# Patient Record
Sex: Female | Born: 1935 | Hispanic: No | Smoking: Never smoker
Health system: Southern US, Community
[De-identification: ages and names within clinical notes are randomized; demographics above are authoritative.]

## PROBLEM LIST (undated history)

## (undated) DIAGNOSIS — E039 Hypothyroidism, unspecified: Secondary | ICD-10-CM

## (undated) DIAGNOSIS — J189 Pneumonia, unspecified organism: Secondary | ICD-10-CM

## (undated) DIAGNOSIS — H5440 Blindness, one eye, unspecified eye: Secondary | ICD-10-CM

## (undated) DIAGNOSIS — E7800 Pure hypercholesterolemia, unspecified: Secondary | ICD-10-CM

## (undated) HISTORY — PX: LARYNX SURGERY: SHX692

---

## 2004-11-14 HISTORY — PX: CATARACT EXTRACTION, BILATERAL: SHX1313

## 2013-02-20 ENCOUNTER — Encounter (HOSPITAL_COMMUNITY): Payer: Self-pay | Admitting: General Practice

## 2013-02-20 ENCOUNTER — Ambulatory Visit
Admission: RE | Admit: 2013-02-20 | Discharge: 2013-02-20 | Disposition: A | Discharge status: Home / Self Care | Payer: Medicare Other | Source: Ambulatory Visit | Attending: Cardiovascular Disease | Admitting: Cardiovascular Disease

## 2013-02-20 ENCOUNTER — Other Ambulatory Visit: Payer: Self-pay | Admitting: Cardiovascular Disease

## 2013-02-20 ENCOUNTER — Inpatient Hospital Stay (HOSPITAL_COMMUNITY)
Admission: AD | Admit: 2013-02-20 | Discharge: 2013-02-22 | DRG: 195 | Disposition: A | Discharge status: Home / Self Care | Payer: Medicare Other | Source: Ambulatory Visit | Attending: Cardiovascular Disease | Admitting: Cardiovascular Disease

## 2013-02-20 DIAGNOSIS — R079 Chest pain, unspecified: Secondary | ICD-10-CM | POA: Diagnosis present

## 2013-02-20 DIAGNOSIS — R05 Cough: Secondary | ICD-10-CM | POA: Diagnosis present

## 2013-02-20 DIAGNOSIS — I10 Essential (primary) hypertension: Secondary | ICD-10-CM | POA: Diagnosis present

## 2013-02-20 DIAGNOSIS — I059 Rheumatic mitral valve disease, unspecified: Secondary | ICD-10-CM | POA: Diagnosis present

## 2013-02-20 DIAGNOSIS — I379 Nonrheumatic pulmonary valve disorder, unspecified: Secondary | ICD-10-CM | POA: Diagnosis present

## 2013-02-20 DIAGNOSIS — R059 Cough, unspecified: Secondary | ICD-10-CM | POA: Diagnosis present

## 2013-02-20 DIAGNOSIS — J40 Bronchitis, not specified as acute or chronic: Secondary | ICD-10-CM

## 2013-02-20 DIAGNOSIS — E785 Hyperlipidemia, unspecified: Secondary | ICD-10-CM | POA: Diagnosis present

## 2013-02-20 DIAGNOSIS — I079 Rheumatic tricuspid valve disease, unspecified: Secondary | ICD-10-CM | POA: Diagnosis present

## 2013-02-20 DIAGNOSIS — J984 Other disorders of lung: Secondary | ICD-10-CM | POA: Diagnosis present

## 2013-02-20 DIAGNOSIS — I359 Nonrheumatic aortic valve disorder, unspecified: Secondary | ICD-10-CM | POA: Diagnosis present

## 2013-02-20 DIAGNOSIS — Z79899 Other long term (current) drug therapy: Secondary | ICD-10-CM

## 2013-02-20 DIAGNOSIS — J189 Pneumonia, unspecified organism: Secondary | ICD-10-CM

## 2013-02-20 HISTORY — DX: Hypothyroidism, unspecified: E03.9

## 2013-02-20 HISTORY — DX: Blindness, one eye, unspecified eye: H54.40

## 2013-02-20 HISTORY — DX: Pneumonia, unspecified organism: J18.9

## 2013-02-20 HISTORY — DX: Pure hypercholesterolemia, unspecified: E78.00

## 2013-02-20 LAB — CBC WITH DIFFERENTIAL/PLATELET
Basophils Relative: 0 % (ref 0–1)
Eosinophils Absolute: 0.1 10*3/uL (ref 0.0–0.7)
Eosinophils Relative: 2 % (ref 0–5)
Lymphs Abs: 2.3 10*3/uL (ref 0.7–4.0)
MCH: 28.4 pg (ref 26.0–34.0)
MCHC: 34.1 g/dL (ref 30.0–36.0)
MCV: 83.2 fL (ref 78.0–100.0)
Platelets: 282 10*3/uL (ref 150–400)
RBC: 4.23 MIL/uL (ref 3.87–5.11)
RDW: 13.7 % (ref 11.5–15.5)

## 2013-02-20 LAB — COMPREHENSIVE METABOLIC PANEL
ALT: 27 U/L (ref 0–35)
AST: 25 U/L (ref 0–37)
Albumin: 2.8 g/dL — ABNORMAL LOW (ref 3.5–5.2)
Alkaline Phosphatase: 83 U/L (ref 39–117)
CO2: 28 mEq/L (ref 19–32)
Chloride: 100 mEq/L (ref 96–112)
Potassium: 3.9 mEq/L (ref 3.5–5.1)
Total Bilirubin: 0.2 mg/dL — ABNORMAL LOW (ref 0.3–1.2)

## 2013-02-20 MED ORDER — SODIUM CHLORIDE 0.9 % IV SOLN
INTRAVENOUS | Status: DC
  Administered 2013-02-20 – 2013-02-21 (×2): via INTRAVENOUS

## 2013-02-20 MED ORDER — ALBUTEROL SULFATE (5 MG/ML) 0.5% IN NEBU: 2.5000 mg | INHALATION_SOLUTION | RESPIRATORY_TRACT | Status: DC | PRN

## 2013-02-20 MED ORDER — GUAIFENESIN-DM 100-10 MG/5ML PO SYRP
10.0000 mL | ORAL_SOLUTION | ORAL | Status: DC | PRN
  Administered 2013-02-21 – 2013-02-22 (×3): 10 mL via ORAL
  Filled 2013-02-20 (×3): qty 10

## 2013-02-20 MED ORDER — ALBUTEROL SULFATE (5 MG/ML) 0.5% IN NEBU
2.5000 mg | INHALATION_SOLUTION | Freq: Four times a day (QID) | RESPIRATORY_TRACT | Status: DC
  Filled 2013-02-20: qty 0.5

## 2013-02-20 MED ORDER — DEXTROSE 5 % IV SOLN
1.0000 g | INTRAVENOUS | Status: DC
  Administered 2013-02-20 – 2013-02-21 (×2): 1 g via INTRAVENOUS
  Filled 2013-02-20 (×3): qty 10

## 2013-02-20 MED ORDER — HEPARIN SODIUM (PORCINE) 5000 UNIT/ML IJ SOLN
5000.0000 [IU] | Freq: Three times a day (TID) | INTRAMUSCULAR | Status: DC
  Administered 2013-02-20 – 2013-02-22 (×6): 5000 [IU] via SUBCUTANEOUS
  Filled 2013-02-20 (×9): qty 1

## 2013-02-20 MED ORDER — DEXTROSE 5 % IV SOLN
500.0000 mg | INTRAVENOUS | Status: DC
  Administered 2013-02-20 – 2013-02-21 (×2): 500 mg via INTRAVENOUS
  Filled 2013-02-20 (×3): qty 500

## 2013-02-20 NOTE — H&P (Signed)
Caroline Ramsey is an 79 y.o. female.   Chief Complaint: Chest pain, cough and chills HPI: 79 years old female with 2 day history of fever, chills and cough has chest pain and x-ray finding of pneumonia.   No past medical history on file.  No DM, II, + hypertension, No smoking or alcohol intake.  No past surgical history on file. Bilateral cataract surgery, Left sided neck surgery  For vocal cord problem. No family history on file. Non-contributery. Social History:  has no tobacco, alcohol, and drug history on file.  Allergies: Allergies not on file. None.  No prescriptions prior to admission    No results found for this or any previous visit (from the past 48 hour(s)). Dg Chest 2 View  02/20/2013  *RADIOLOGY REPORT*  Clinical Data: , evaluate for pneumonia.  Fever  CHEST - 2 VIEW  Comparison: None  Findings: There is moderate cardiac enlargement.  The lungs are hyperinflated.  No pleural effusion or edema. Advanced chronic lung disease is identified bilaterally suspicious for COPD/emphysema.  Superimposed left upper lobe airspace opacity is noted which is concerning for pneumonia.  IMPRESSION:  1.  Suspect left upper lobe pneumonia. 2.  Advanced chronic appearing lung disease. 3.  Cardiac enlargement.   Original Report Authenticated By: Signa Kell, M.D.     @ROS @ No weight gain or loss, + poor vision, + full upper and lower dentures, No asthma, No chest pain, No GI bleed, No stroke, No arthritis, No Kidney stone.   Blood pressure 165/53, pulse 70, temperature 97.9 F (36.6 C), temperature source Oral, resp. rate 20, height 4\' 10"  (1.473 m), weight 45.224 kg (99 lb 11.2 oz), SpO2 95.00%. Gen: Awake, Mild respiratory distress. HEENT: Normocephalic, atraumatic, Brown/black eyes, Tongue midline and pink. Neck: No JVD. No lymphadenopathy or tenderness. Lungs-Harsh breath sounds, bilaterally. Heart: S1, S2 normal, II/VI systolic murmur. Abdomen:- Soft and non-tender. Ext:- No swelling.  Early varicose veins. CNS:-AxOx 3. Moves all 4 ext.    Assessment/Plan Left lung pneumonia Hypertension Hyperlipidemia  Admit/IV antibiotic/Breathing treatments/Supplemental oxygen.  Halea Lieb S 02/20/2013, 2:41 PM

## 2013-02-21 MED ORDER — METOPROLOL SUCCINATE ER 25 MG PO TB24
25.0000 mg | ORAL_TABLET | Freq: Every day | ORAL | Status: DC
  Administered 2013-02-22: 25 mg via ORAL
  Filled 2013-02-21: qty 1

## 2013-02-21 MED ORDER — AMLODIPINE BESYLATE 2.5 MG PO TABS
2.5000 mg | ORAL_TABLET | Freq: Every day | ORAL | Status: DC
  Administered 2013-02-21 – 2013-02-22 (×2): 2.5 mg via ORAL
  Filled 2013-02-21 (×2): qty 1

## 2013-02-21 NOTE — Progress Notes (Signed)
  Echocardiogram 2D Echocardiogram has been performed.  Caroline Ramsey 02/21/2013, 10:29 AM

## 2013-02-21 NOTE — Progress Notes (Signed)
Subjective:  Feeling better. Afebrile. Echo cardiogram with mild MR, TR, AR and PR. EF-65 %  Objective:  Vital Signs in the last 24 hours: Temp:  [98.3 F (36.8 C)-98.7 F (37.1 C)] 98.3 F (36.8 C) (04/10 1339) Pulse Rate:  [66-76] 76 (04/10 1339) Cardiac Rhythm:  [-] Normal sinus rhythm (04/09 1940) Resp:  [20] 20 (04/10 1339) BP: (124-136)/(42-57) 124/57 mmHg (04/10 1339) SpO2:  [93 %-97 %] 97 % (04/10 1339) Weight:  [45 kg (99 lb 3.3 oz)] 45 kg (99 lb 3.3 oz) (04/10 0510)  Physical Exam: BP Readings from Last 1 Encounters:  02/21/13 124/57     Wt Readings from Last 1 Encounters:  02/21/13 45 kg (99 lb 3.3 oz)    Weight change:   HEENT: [place]/AT, Eyes-Brown/Black, PERL, EOMI, Conjunctiva-Pink, Sclera-Non-icteric Neck: No JVD, No bruit, Trachea midline. Lungs:  Clearing, Bilateral. Cardiac:  Regular rhythm, normal S1 and S2, no S3.  Abdomen:  Soft, non-tender. Extremities:  No edema present. No cyanosis. No clubbing. CNS: AxOx3, Cranial nerves grossly intact, moves all 4 extremities. Right handed. Skin: Warm and dry.   Intake/Output from previous day: 04/09 0701 - 04/10 0700 In: 1020 [P.O.:120; I.V.:900] Out: -     Lab Results: BMET    Component Value Date/Time   NA 137 02/20/2013 1534   K 3.9 02/20/2013 1534   CL 100 02/20/2013 1534   CO2 28 02/20/2013 1534   GLUCOSE 97 02/20/2013 1534   BUN 14 02/20/2013 1534   CREATININE 0.59 02/20/2013 1534   CALCIUM 9.5 02/20/2013 1534   GFRNONAA 82* 02/20/2013 1534   GFRAA >90 02/20/2013 1534   CBC    Component Value Date/Time   WBC 8.3 02/20/2013 1534   RBC 4.23 02/20/2013 1534   HGB 12.0 02/20/2013 1534   HCT 35.2* 02/20/2013 1534   PLT 282 02/20/2013 1534   MCV 83.2 02/20/2013 1534   MCH 28.4 02/20/2013 1534   MCHC 34.1 02/20/2013 1534   RDW 13.7 02/20/2013 1534   LYMPHSABS 2.3 02/20/2013 1534   MONOABS 0.5 02/20/2013 1534   EOSABS 0.1 02/20/2013 1534   BASOSABS 0.0 02/20/2013 1534   CARDIAC ENZYMES No results found for this basename: CKTOTAL,  CKMB, CKMBINDEX, TROPONINI    Scheduled Meds: . amLODipine  2.5 mg Oral Daily  . azithromycin  500 mg Intravenous Q24H  . cefTRIAXone (ROCEPHIN)  IV  1 g Intravenous Q24H  . heparin  5,000 Units Subcutaneous Q8H   Continuous Infusions: . sodium chloride 75 mL/hr at 02/21/13 0522   PRN Meds:.albuterol, guaiFENesin-dextromethorphan  Assessment/Plan: Left lung pneumonia  Hypertension  Hyperlipidemia Mitral regurgitation Aortic valve regurgitation Tricuspid regurgitation Pulmonary valve regurgitation  Continue medications. Add small dose of vasodilator and B-blocker.   LOS: 1 day    Orpah Cobb  MD  02/21/2013, 6:48 PM

## 2013-02-21 NOTE — Care Management Note (Unsigned)
    Page 1 of 1   02/21/2013     2:14:07 PM   CARE MANAGEMENT NOTE 02/21/2013  Patient:  Caroline Ramsey, Caroline Ramsey   Account Number:  [id number]  Date Initiated:  02/21/2013  Documentation initiated by:  Schelly Chuba  Subjective/Objective Assessment:   PT ADM ON 02/20/13 WITH SOB, PNA.  PTA, PT LIVES WITH GRANDSON, AND IS INDEPENDENT.     Action/Plan:   WILL FOLLOW FOR HOME NEEDS AS PT PROGRESSES.   Anticipated DC Date:  02/23/2013   Anticipated DC Plan:  HOME W HOME HEALTH SERVICES      DC Planning Services  CM consult      Choice offered to / List presented to:             Status of service:  In process, will continue to follow Medicare Important Message given?   (If response is "NO", the following Medicare IM given date fields will be blank) Date Medicare IM given:   Date Additional Medicare IM given:    Discharge Disposition:    Per UR Regulation:  Reviewed for med. necessity/level of care/duration of stay  If discussed at Long Length of Stay Meetings, dates discussed:    Comments:

## 2013-02-22 MED ORDER — MOXIFLOXACIN HCL 400 MG PO TABS: 400.0000 mg | ORAL_TABLET | Freq: Every day | ORAL | Status: DC

## 2013-02-22 MED ORDER — AZITHROMYCIN 250 MG PO TABS: 250.0000 mg | ORAL_TABLET | Freq: Every day | ORAL | Status: DC

## 2013-02-22 MED ORDER — AZITHROMYCIN 250 MG PO TABS
250.0000 mg | ORAL_TABLET | Freq: Every day | ORAL | Status: DC
  Administered 2013-02-22: 250 mg via ORAL
  Filled 2013-02-22: qty 1

## 2013-02-22 MED ORDER — AZITHROMYCIN 250 MG PO TABS: ORAL_TABLET | ORAL | Status: DC

## 2013-02-22 MED ORDER — METOPROLOL SUCCINATE ER 25 MG PO TB24: 25.0000 mg | ORAL_TABLET | Freq: Every day | ORAL | Status: DC

## 2013-02-22 NOTE — Discharge Summary (Signed)
Physician Discharge Summary  Patient ID: Caroline Ramsey MRN: [id number] DOB/AGE: [birth year] 77 y.o.  Admit date: 02/20/2013 Discharge date: 02/22/2013  Admission Diagnoses: Left lung pneumonia  Hypertension  Hyperlipidemia  Discharge Diagnoses:  Principle Problem:   * Acute left lung pneumonia* Hypertension  Hyperlipidemia  Mitral regurgitation  Aortic valve regurgitation  Tricuspid regurgitation  Pulmonary valve regurgitation  Discharged Condition: good  Hospital Course: 79 years old female with 2 day history of fever, chills and cough has chest pain and x-ray finding of pneumonia. She responded well to IV antibiotic and breathing treatments. She was switched to oral antibiotic in AM and discharged home in stable condition.  Consults: None  Significant Diagnostic Studies: labs: Normal electrolytes and CBC.  Radiology: CXR: Left upper lobe pneumonia, Chronic lung disease and cardiac enlargement.  Treatments: IV hydration and antibiotics: ceftriaxone and azithromycin  Discharge Exam: Blood pressure 137/58, pulse 66, temperature 98.2 F (36.8 C), temperature source Oral, resp. rate 18, height 4\' 10"  (1.473 m), weight 46 kg (101 lb 6.6 oz), SpO2 96.00%. Gen: Awake, No respiratory distress.  HEENT: Normocephalic, atraumatic, Brown/black eyes, Tongue midline and pink.  Neck: No JVD. No lymphadenopathy or tenderness.  Lungs-Harsh breath sounds, bilaterally.  Heart: S1, S2 normal, II/VI systolic murmur.  Abdomen:- Soft and non-tender.  Ext:- No swelling. Early varicose veins.  CNS:-AxOx 3. Moves all 4 ext.  Skin:-Warm and dry.  Disposition: 01 Home, Self care.     Medication List    TAKE these medications       alendronate 70 MG tablet  Commonly known as:  FOSAMAX  Take 70 mg by mouth every 7 (seven) days. Take with a full glass of water on an empty stomach.     amLODipine 2.5 MG tablet  Commonly known as:  NORVASC  Take 2.5 mg by mouth daily.     azithromycin 250  MG tablet  Commonly known as:  ZITHROMAX  One PO daily x 4 days     levothyroxine 50 MCG tablet  Commonly known as:  SYNTHROID, LEVOTHROID  Take 50 mcg by mouth See admin instructions. Monday through Friday     levothyroxine 75 MCG tablet  Commonly known as:  SYNTHROID, LEVOTHROID  Take 75 mcg by mouth 2 (two) times a week.     metoprolol succinate 25 MG 24 hr tablet  Commonly known as:  TOPROL-XL  Take 1 tablet (25 mg total) by mouth daily.     moxifloxacin 400 MG tablet  Commonly known as:  AVELOX  Take 1 tablet (400 mg total) by mouth daily.     simvastatin 10 MG tablet  Commonly known as:  ZOCOR  Take 10 mg by mouth at bedtime.           Follow-up Information   Follow up with [facility] S, MD. Schedule an appointment as soon as possible for a visit in 1 week.   Contact information:   [address] [place] [address] [phone]       Signed: Ricki Rodriguez 02/22/2013, 4:26 PM

## 2013-02-26 LAB — CULTURE, BLOOD (ROUTINE X 2): Culture: NO GROWTH

## 2015-01-22 ENCOUNTER — Emergency Department (HOSPITAL_COMMUNITY)
Admission: EM | Admit: 2015-01-22 | Discharge: 2015-01-22 | Disposition: A | Discharge status: Home / Self Care | Payer: Medicare Other | Attending: Emergency Medicine | Admitting: Emergency Medicine

## 2015-01-22 ENCOUNTER — Emergency Department (HOSPITAL_COMMUNITY): Payer: Medicare Other

## 2015-01-22 ENCOUNTER — Encounter (HOSPITAL_COMMUNITY): Payer: Self-pay | Admitting: Emergency Medicine

## 2015-01-22 DIAGNOSIS — R05 Cough: Secondary | ICD-10-CM | POA: Diagnosis not present

## 2015-01-22 DIAGNOSIS — E780 Pure hypercholesterolemia: Secondary | ICD-10-CM | POA: Diagnosis not present

## 2015-01-22 DIAGNOSIS — R079 Chest pain, unspecified: Secondary | ICD-10-CM | POA: Diagnosis present

## 2015-01-22 DIAGNOSIS — R059 Cough, unspecified: Secondary | ICD-10-CM

## 2015-01-22 DIAGNOSIS — R0789 Other chest pain: Secondary | ICD-10-CM | POA: Diagnosis not present

## 2015-01-22 DIAGNOSIS — R509 Fever, unspecified: Secondary | ICD-10-CM | POA: Diagnosis not present

## 2015-01-22 DIAGNOSIS — Z8701 Personal history of pneumonia (recurrent): Secondary | ICD-10-CM | POA: Insufficient documentation

## 2015-01-22 DIAGNOSIS — H5442 Blindness, left eye, normal vision right eye: Secondary | ICD-10-CM | POA: Insufficient documentation

## 2015-01-22 DIAGNOSIS — E039 Hypothyroidism, unspecified: Secondary | ICD-10-CM | POA: Insufficient documentation

## 2015-01-22 DIAGNOSIS — Z792 Long term (current) use of antibiotics: Secondary | ICD-10-CM | POA: Diagnosis not present

## 2015-01-22 DIAGNOSIS — Z79899 Other long term (current) drug therapy: Secondary | ICD-10-CM | POA: Diagnosis not present

## 2015-01-22 LAB — URINALYSIS, ROUTINE W REFLEX MICROSCOPIC
BILIRUBIN URINE: NEGATIVE
GLUCOSE, UA: NEGATIVE mg/dL
KETONES UR: 15 mg/dL — AB
Leukocytes, UA: NEGATIVE
Nitrite: NEGATIVE
PROTEIN: NEGATIVE mg/dL
SPECIFIC GRAVITY, URINE: 1.009 (ref 1.005–1.030)
Urobilinogen, UA: 0.2 mg/dL (ref 0.0–1.0)
pH: 6 (ref 5.0–8.0)

## 2015-01-22 LAB — COMPREHENSIVE METABOLIC PANEL
ALK PHOS: 60 U/L (ref 39–117)
ALT: 9 U/L (ref 0–35)
ANION GAP: 7 (ref 5–15)
AST: 20 U/L (ref 0–37)
Albumin: 3.4 g/dL — ABNORMAL LOW (ref 3.5–5.2)
BILIRUBIN TOTAL: 1.3 mg/dL — AB (ref 0.3–1.2)
BUN: 14 mg/dL (ref 6–23)
CO2: 25 mmol/L (ref 19–32)
CREATININE: 0.85 mg/dL (ref 0.50–1.10)
Calcium: 9.8 mg/dL (ref 8.4–10.5)
Chloride: 103 mmol/L (ref 96–112)
GFR calc Af Amer: 70 mL/min — ABNORMAL LOW (ref >90)
GFR, EST NON AFRICAN AMERICAN: 61 mL/min — AB (ref >90)
GLUCOSE: 102 mg/dL — AB (ref 70–99)
POTASSIUM: 4 mmol/L (ref 3.5–5.1)
SODIUM: 135 mmol/L (ref 135–145)
TOTAL PROTEIN: 7.5 g/dL (ref 6.0–8.3)

## 2015-01-22 LAB — CBC
HCT: 40.9 % (ref 36.0–46.0)
HEMOGLOBIN: 13.6 g/dL (ref 12.0–15.0)
MCH: 29.3 pg (ref 26.0–34.0)
MCHC: 33.3 g/dL (ref 30.0–36.0)
MCV: 88.1 fL (ref 78.0–100.0)
Platelets: 172 10*3/uL (ref 150–400)
RBC: 4.64 MIL/uL (ref 3.87–5.11)
RDW: 13.7 % (ref 11.5–15.5)
WBC: 12.7 10*3/uL — ABNORMAL HIGH (ref 4.0–10.5)

## 2015-01-22 LAB — URINE MICROSCOPIC-ADD ON

## 2015-01-22 LAB — I-STAT TROPONIN, ED: TROPONIN I, POC: 0 ng/mL (ref 0.00–0.08)

## 2015-01-22 MED ORDER — BENZONATATE 100 MG PO CAPS: 100.0000 mg | ORAL_CAPSULE | Freq: Three times a day (TID) | ORAL | Status: DC

## 2015-01-22 MED ORDER — ASPIRIN 325 MG PO TABS: 325.0000 mg | ORAL_TABLET | ORAL | Status: DC

## 2015-01-22 MED ORDER — SODIUM CHLORIDE 0.9 % IV BOLUS (SEPSIS)
500.0000 mL | Freq: Once | INTRAVENOUS | Status: AC
  Administered 2015-01-22: 500 mL via INTRAVENOUS

## 2015-01-22 MED ORDER — NITROGLYCERIN 2 % TD OINT: 0.5000 [in_us] | TOPICAL_OINTMENT | TRANSDERMAL | Status: DC

## 2015-01-22 MED ORDER — ACETAMINOPHEN 325 MG PO TABS
650.0000 mg | ORAL_TABLET | Freq: Once | ORAL | Status: AC
  Administered 2015-01-22: 650 mg via ORAL
  Filled 2015-01-22: qty 2

## 2015-01-22 NOTE — ED Notes (Addendum)
Pt speaks gujairati. Per pt family member pt having cp that started last night. Pt also has had cough and fever x 2 days. Denies nvd.

## 2015-01-22 NOTE — ED Provider Notes (Signed)
CSN: [id number]     Arrival date & time 01/22/15  4098 History   First MD Initiated Contact with Patient 01/22/15 0914     Chief Complaint  Patient presents with  . Chest Pain     (Consider location/radiation/quality/duration/timing/severity/associated sxs/prior Treatment) Patient is a 81 y.o. female presenting with cough. The history is provided by a caregiver.  Cough Cough characteristics:  Non-productive Severity:  Mild Onset quality:  Sudden Duration:  2 days Timing:  Constant Progression:  Worsening Chronicity:  New Context: upper respiratory infection   Relieved by:  Nothing Worsened by:  Nothing tried Ineffective treatments:  None tried Associated symptoms: chills and fever     Past Medical History  Diagnosis Date  . High cholesterol   . Hypothyroidism   . Pneumonia 02/20/2013    LLL  . Blind left eye    Past Surgical History  Procedure Laterality Date  . Larynx surgery  ~ 2000    "couldn't speak; had to have surgery" (02/20/2013)  . Cataract extraction, bilateral  2006   No family history on file. History  Substance Use Topics  . Smoking status: Never Smoker   . Smokeless tobacco: Never Used  . Alcohol Use: No   OB History    No data available     Review of Systems  Constitutional: Positive for fever and chills.  HENT: Negative for congestion and drooling.   Eyes: Negative for pain.  Respiratory: Positive for cough.   Gastrointestinal: Negative for diarrhea.  Genitourinary: Negative for dysuria and hematuria.  Musculoskeletal: Negative for gait problem and neck pain.  Skin: Negative for color change.  Hematological: Negative for adenopathy.  Psychiatric/Behavioral: Negative for behavioral problems.  All other systems reviewed and are negative.     Allergies  Review of patient's allergies indicates no known allergies.  Home Medications   Prior to Admission medications   Medication Sig Start Date End Date Taking? Authorizing Provider   alendronate (FOSAMAX) 70 MG tablet Take 70 mg by mouth every 7 (seven) days. Take with a full glass of water on an empty stomach.    Historical Provider, MD  amLODipine (NORVASC) 2.5 MG tablet Take 2.5 mg by mouth daily.    Historical Provider, MD  azithromycin (ZITHROMAX) 250 MG tablet One PO daily x 4 days 02/22/13   Orpah Cobb, MD  levothyroxine (SYNTHROID, LEVOTHROID) 50 MCG tablet Take 50 mcg by mouth See admin instructions. Monday through Friday    Historical Provider, MD  levothyroxine (SYNTHROID, LEVOTHROID) 75 MCG tablet Take 75 mcg by mouth 2 (two) times a week.    Historical Provider, MD  metoprolol succinate (TOPROL-XL) 25 MG 24 hr tablet Take 1 tablet (25 mg total) by mouth daily. 02/22/13   Orpah Cobb, MD  moxifloxacin (AVELOX) 400 MG tablet Take 1 tablet (400 mg total) by mouth daily. 02/22/13   Orpah Cobb, MD  simvastatin (ZOCOR) 10 MG tablet Take 10 mg by mouth at bedtime.    Historical Provider, MD   BP 144/42 mmHg  Pulse 60  Temp(Src) 98.2 F (36.8 C) (Oral)  Resp 16  SpO2 93% Physical Exam  Constitutional: She is oriented to person, place, and time. She appears well-developed and well-nourished.  HENT:  Head: Normocephalic.  Mouth/Throat: Oropharynx is clear and moist. No oropharyngeal exudate.  Eyes: Conjunctivae and EOM are normal. Pupils are equal, round, and reactive to light.  Neck: Normal range of motion. Neck supple.  Cardiovascular: Normal rate, regular rhythm, normal heart sounds and intact distal  pulses.  Exam reveals no gallop and no friction rub.   No murmur heard. Pulmonary/Chest: Effort normal and breath sounds normal. No respiratory distress. She has no wheezes. She exhibits tenderness (reproducible tenderness to palpation of the left anterior chest wall.).  Abdominal: Soft. Bowel sounds are normal. There is no tenderness. There is no rebound and no guarding.  Musculoskeletal: Normal range of motion. She exhibits no edema or tenderness.  Symmetric  lower extremities without focal tenderness.  Neurological: She is alert and oriented to person, place, and time.  The patient is ambulatory.  Skin: Skin is warm and dry.  Psychiatric: She has a normal mood and affect. Her behavior is normal.  Nursing note and vitals reviewed.   ED Course  Procedures (including critical care time) Labs Review Labs Reviewed  CBC - Abnormal; Notable for the following:    WBC 12.7 (*)    All other components within normal limits  COMPREHENSIVE METABOLIC PANEL - Abnormal; Notable for the following:    Glucose, Bld 102 (*)    Albumin 3.4 (*)    Total Bilirubin 1.3 (*)    GFR calc non Af Amer 61 (*)    GFR calc Af Amer 70 (*)    All other components within normal limits  URINALYSIS, ROUTINE W REFLEX MICROSCOPIC - Abnormal; Notable for the following:    Hgb urine dipstick SMALL (*)    Ketones, ur 15 (*)    All other components within normal limits  URINE MICROSCOPIC-ADD ON - Abnormal; Notable for the following:    Bacteria, UA FEW (*)    All other components within normal limits  I-STAT TROPOININ, ED  Rosezena Sensor, ED    Imaging Review Dg Chest 2 View  01/22/2015   CLINICAL DATA:  Chest pain  EXAM: CHEST  2 VIEW  COMPARISON:  02/20/2013  FINDINGS: Cardiomegaly again noted. Mild hyperinflation. There is residual mild interstitial prominence bilaterally. Mild interstitial edema or pneumonitis cannot be excluded. No segmental infiltrate.  IMPRESSION: Cardiomegaly. Mild hyperinflation. Reticular mild interstitial prominence bilaterally. Mild edema or pneumonitis cannot be excluded. No segmental infiltrate.   Electronically Signed   By: Natasha Mead M.D.   On: 01/22/2015 10:56     EKG Interpretation   Date/Time:  Thursday January 22 2015 09:00:43 EST Ventricular Rate:  61 PR Interval:  154 QRS Duration: 76 QT Interval:  378 QTC Calculation: 380 R Axis:   63 Text Interpretation:  Normal sinus rhythm no previous for comparison  Confirmed by Lamerle Jabs   MD, Marianny Goris ([id number]) on 01/22/2015 9:14:44 AM      MDM   Final diagnoses:  Chest wall pain  Cough    9:40 AM 81 y.o. female w hx of hlp who presents with a nonproductive cough, subjective fever, chills, and chest wall pain for the last 2-3 days. The family member notes that his wife was sick with a similar illness about one week ago. The patient appears well on exam. She is afebrile and vital signs are unremarkable here. We'll get screening lab work and imaging. Chest pain is reproducible with palpation on my exam as well as coughing.   12:58 PM: I interpreted/reviewed the labs and/or imaging which were non-contributory.  Pt continues to appear well. Hx of viral syndrome in other family members. Her sx c/w this.  I have discussed the diagnosis/risks/treatment options with the patient and family and believe the pt to be eligible for discharge home to follow-up with her pcp. We also discussed returning to  the ED immediately if new or worsening sx occur. We discussed the sx which are most concerning (e.g., sob, worsening cp, fever, inability to tolerate po) that necessitate immediate return. Medications administered to the patient during their visit and any new prescriptions provided to the patient are listed below.  Medications given during this visit Medications  sodium chloride 0.9 % bolus 500 mL (0 mLs Intravenous Stopped 01/22/15 1139)  acetaminophen (TYLENOL) tablet 650 mg (650 mg Oral Given 01/22/15 1014)    New Prescriptions   BENZONATATE (TESSALON) 100 MG CAPSULE    Take 1 capsule (100 mg total) by mouth every 8 (eight) hours.     Purvis SheffieldForrest Braydon Kullman, MD 01/22/15 1259

## 2015-01-22 NOTE — Discharge Instructions (Signed)

## 2018-12-24 ENCOUNTER — Encounter (HOSPITAL_COMMUNITY): Payer: Self-pay | Admitting: *Deleted

## 2018-12-24 ENCOUNTER — Emergency Department (HOSPITAL_COMMUNITY): Payer: Medicare Other

## 2018-12-24 ENCOUNTER — Other Ambulatory Visit: Payer: Self-pay

## 2018-12-24 ENCOUNTER — Inpatient Hospital Stay (HOSPITAL_COMMUNITY)
Admission: EM | Admit: 2018-12-24 | Discharge: 2018-12-28 | DRG: 470 | Disposition: A | Discharge status: Home Health | Payer: Medicare Other | Attending: Internal Medicine | Admitting: Internal Medicine

## 2018-12-24 DIAGNOSIS — Z7983 Long term (current) use of bisphosphonates: Secondary | ICD-10-CM

## 2018-12-24 DIAGNOSIS — D62 Acute posthemorrhagic anemia: Secondary | ICD-10-CM | POA: Diagnosis not present

## 2018-12-24 DIAGNOSIS — W109XXA Fall (on) (from) unspecified stairs and steps, initial encounter: Secondary | ICD-10-CM | POA: Diagnosis present

## 2018-12-24 DIAGNOSIS — K5900 Constipation, unspecified: Secondary | ICD-10-CM | POA: Diagnosis not present

## 2018-12-24 DIAGNOSIS — Z79890 Hormone replacement therapy (postmenopausal): Secondary | ICD-10-CM | POA: Diagnosis not present

## 2018-12-24 DIAGNOSIS — I351 Nonrheumatic aortic (valve) insufficiency: Secondary | ICD-10-CM | POA: Diagnosis present

## 2018-12-24 DIAGNOSIS — I517 Cardiomegaly: Secondary | ICD-10-CM | POA: Diagnosis present

## 2018-12-24 DIAGNOSIS — S72002S Fracture of unspecified part of neck of left femur, sequela: Secondary | ICD-10-CM | POA: Diagnosis not present

## 2018-12-24 DIAGNOSIS — S72002A Fracture of unspecified part of neck of left femur, initial encounter for closed fracture: Secondary | ICD-10-CM | POA: Diagnosis present

## 2018-12-24 DIAGNOSIS — E039 Hypothyroidism, unspecified: Secondary | ICD-10-CM | POA: Diagnosis present

## 2018-12-24 DIAGNOSIS — S72142A Displaced intertrochanteric fracture of left femur, initial encounter for closed fracture: Secondary | ICD-10-CM | POA: Diagnosis present

## 2018-12-24 DIAGNOSIS — I10 Essential (primary) hypertension: Secondary | ICD-10-CM | POA: Diagnosis present

## 2018-12-24 DIAGNOSIS — H5462 Unqualified visual loss, left eye, normal vision right eye: Secondary | ICD-10-CM | POA: Diagnosis present

## 2018-12-24 DIAGNOSIS — E785 Hyperlipidemia, unspecified: Secondary | ICD-10-CM | POA: Diagnosis present

## 2018-12-24 DIAGNOSIS — Z96649 Presence of unspecified artificial hip joint: Secondary | ICD-10-CM

## 2018-12-24 LAB — CBC WITH DIFFERENTIAL/PLATELET
Abs Immature Granulocytes: 0.07 10*3/uL (ref 0.00–0.07)
BASOS PCT: 0 %
Basophils Absolute: 0 10*3/uL (ref 0.0–0.1)
Eosinophils Absolute: 0 10*3/uL (ref 0.0–0.5)
Eosinophils Relative: 0 %
HCT: 37.9 % (ref 36.0–46.0)
Hemoglobin: 12.2 g/dL (ref 12.0–15.0)
Immature Granulocytes: 1 %
Lymphocytes Relative: 9 %
Lymphs Abs: 1.3 10*3/uL (ref 0.7–4.0)
MCH: 29.2 pg (ref 26.0–34.0)
MCHC: 32.2 g/dL (ref 30.0–36.0)
MCV: 90.7 fL (ref 80.0–100.0)
Monocytes Absolute: 0.9 10*3/uL (ref 0.1–1.0)
Monocytes Relative: 6 %
Neutro Abs: 12.3 10*3/uL — ABNORMAL HIGH (ref 1.7–7.7)
Neutrophils Relative %: 84 %
Platelets: 302 10*3/uL (ref 150–400)
RBC: 4.18 MIL/uL (ref 3.87–5.11)
RDW: 13.1 % (ref 11.5–15.5)
WBC: 14.6 10*3/uL — ABNORMAL HIGH (ref 4.0–10.5)
nRBC: 0 % (ref 0.0–0.2)

## 2018-12-24 LAB — PROTIME-INR
INR: 1.02
Prothrombin Time: 13.3 seconds (ref 11.4–15.2)

## 2018-12-24 LAB — BASIC METABOLIC PANEL
Anion gap: 10 (ref 5–15)
BUN: 13 mg/dL (ref 8–23)
CALCIUM: 10 mg/dL (ref 8.9–10.3)
CO2: 23 mmol/L (ref 22–32)
Chloride: 104 mmol/L (ref 98–111)
Creatinine, Ser: 0.87 mg/dL (ref 0.44–1.00)
GFR calc Af Amer: >60 mL/min (ref >60)
GFR, EST NON AFRICAN AMERICAN: 59 mL/min — AB (ref >60)
Glucose, Bld: 138 mg/dL — ABNORMAL HIGH (ref 70–99)
Potassium: 4 mmol/L (ref 3.5–5.1)
SODIUM: 137 mmol/L (ref 135–145)

## 2018-12-24 LAB — ABO/RH: ABO/RH(D): A POS

## 2018-12-24 LAB — TYPE AND SCREEN
ABO/RH(D): A POS
Antibody Screen: NEGATIVE

## 2018-12-24 MED ORDER — FENTANYL CITRATE (PF) 100 MCG/2ML IJ SOLN
50.0000 ug | Freq: Once | INTRAMUSCULAR | Status: AC
  Administered 2018-12-24: 50 ug via INTRAVENOUS
  Filled 2018-12-24: qty 2

## 2018-12-24 NOTE — H&P (Signed)
Caroline Ramsey ZOX:[id number] DOB: 01/12/1927 DOA: 12/24/2018     PCP: [facility] Specialists NONE    Patient arrived to ER on 12/24/18 at 1423  Patient coming from: home Lives With family   Chief Complaint:  Chief Complaint  Patient presents with  . Gait Problem    HPI: Caroline Ramsey is a 85 y.o. female with medical history significant of htn, blind in left eye    Presented with   Fall Jan 29th she missed the last step and fell down was caught by her Caroline Ramsey but still fell and landed on her left hip did not hit her head no LOC. Plain imaging neg initially but continued to have hip pain presented today.  Left fem neck fracture.  she never reports any chest pain walks at baseline without assist No dyspnea, no known hx of CAD  Regarding pertinent Chronic problems: hypertension on metoprolol no longer on Norvasc, hx of hypothyroidism on synthroid   While in ER: Plain imaging showing Significantly displaced fracture of the LEFT femoral neck, The following Work up has been ordered so far:  Orders Placed This Encounter  Procedures  . DG Hip Unilat With Pelvis 2-3 Views Left  . DG Chest 1 View  . Basic metabolic panel  . CBC with Differential  . Urinalysis, Routine w reflex microscopic  . Protime-INR  . Consult to orthopedic surgery  . Consult to hospitalist  . Consult to hospitalist  . ED EKG  . EKG 12-Lead  . Type and screen [facility]  . Saline lock IV    Following Medications were ordered in ER: Medications  fentaNYL (SUBLIMAZE) injection 50 mcg (50 mcg Intravenous Given 12/24/18 2049)    Significant initial  Findings: Abnormal Labs Reviewed  BASIC METABOLIC PANEL - Abnormal; Notable for the following components:      Result Value   Glucose, Bld 138 (*)    GFR calc non Af Amer 59 (*)    All other components within normal limits  CBC WITH DIFFERENTIAL/PLATELET - Abnormal; Notable for the following components:   WBC 14.6 (*)    Neutro Abs 12.3 (*)    All other components within normal limits     Lactic Acid, Venous No results found for: LATICACIDVEN  Na 137 K 4.0  Cr    stable,   Lab Results  Component Value Date   CREATININE 0.87 12/24/2018   CREATININE 0.85 01/22/2015   CREATININE 0.59 02/20/2013      WBC 14.6  HG/HCT  stable,      Component Value Date/Time   HGB 12.2 12/24/2018 2001   HCT 37.9 12/24/2018 2001      UA    ordered     CXR - cardiomegaly  CT left hip Left femoral neck fracture with varus angulation as seen on prior plain films  ECG:  Personally reviewed by me showing: HR : 71 Rhythm:  NSR,  no evidence of ischemic changes QTC 460     ED Triage Vitals  Enc Vitals Group     BP 12/24/18 1445 (!) 142/55     Pulse Rate 12/24/18 1445 78     Resp 12/24/18 1445 14     Temp 12/24/18 1445 98.5 F (36.9 C)     Temp Source 12/24/18 1445 Oral     SpO2 12/24/18 1445 96 %     Weight 12/24/18 1444 110 lb (49.9 kg)     Height 12/24/18 1444 4'  9" (1.448 m)     Head Circumference --      Peak Flow --      Pain Score 12/24/18 1444 10     Pain Loc --      Pain Edu? --      Excl. in GC? --   TMAX(24)@       Latest  Blood pressure (!) 169/56, pulse 64, temperature 98.5 F (36.9 C), temperature source Oral, resp. rate 20, height 4\' 9"  (1.448 m), weight 49.9 kg, SpO2 96 %.    ER Provider Called:  Orthopedics     Dr.Thompson  They Recommend admit to medicine plan to operate at 11 AM Will see   in ER  Hospitalist was called for admission for left hip fracture   Review of Systems:    Pertinent positives include: left hip pain  Constitutional:  No weight loss, night sweats, Fevers, chills, fatigue, weight loss  HEENT:  No headaches, Difficulty swallowing,Tooth/dental problems,Sore throat,  No sneezing, itching, ear ache, nasal congestion, post nasal drip,  Cardio-vascular:  No chest pain, Orthopnea, PND, anasarca, dizziness, palpitations.no Bilateral  lower extremity swelling  GI:  No heartburn, indigestion, abdominal pain, nausea, vomiting, diarrhea, change in bowel habits, loss of appetite, melena, blood in stool, hematemesis Resp:  no shortness of breath at rest. No dyspnea on exertion, No excess mucus, no productive cough, No non-productive cough, No coughing up of blood.No change in color of mucus.No wheezing. Skin:  no rash or lesions. No jaundice GU:  no dysuria, change in color of urine, no urgency or frequency. No straining to urinate.  No flank pain.  Musculoskeletal:  No joint pain or no joint swelling. No decreased range of motion. No back pain.  Psych:  No change in mood or affect. No depression or anxiety. No memory loss.  Neuro: no localizing neurological complaints, no tingling, no weakness, no double vision, no gait abnormality, no slurred speech, no confusion  All systems reviewed and apart from HOPI all are negative  Past Medical History:   Past Medical History:  Diagnosis Date  . Blind left eye   . High cholesterol   . Hypothyroidism   . Pneumonia 02/20/2013   LLL      Past Surgical History:  Procedure Laterality Date  . CATARACT EXTRACTION, BILATERAL  2006  . LARYNX SURGERY  ~ 2000   "couldn't speak; had to have surgery" (02/20/2013)    Social History:  Ambulatory   independently      reports that she has never smoked. She has never used smokeless tobacco. She reports that she does not drink alcohol or use drugs.     Family History:   Family History  Problem Relation Age of Onset  . Diabetes Other   . Hypertension Neg Hx   . CAD Neg Hx     Allergies: No Known Allergies   Prior to Admission medications   Not on File   Physical Exam: Blood pressure (!) 169/56, pulse 64, temperature 98.5 F (36.9 C), temperature source Oral, resp. rate 20, height 4\' 9"  (1.448 m), weight 49.9 kg, SpO2 96 %. 1. General:  in No Acute distress    Chronically ill -appearing 2. Psychological: Alert and   Oriented 3. Head/ENT:    Dry Mucous Membranes                          Head Non traumatic, neck supple  Poor Dentition 4. SKIN:   decreased Skin turgor,  Skin clean Dry and intact no rash 5. Heart: Regular rate and rhythm systolic Murmur, no Rub or gallop + lift 6. Lungs:  no wheezes or crackles   7. Abdomen: Soft, non-tender, Non distended bowel sounds present 8. Lower extremities: no clubbing, cyanosis, no edema 9. Neurologically Grossly intact, moving all 4 extremities equally  10. MSK: Normal range of motion limited on the left due to pain   LABS:     Recent Labs  Lab 12/24/18 2001  WBC 14.6*  NEUTROABS 12.3*  HGB 12.2  HCT 37.9  MCV 90.7  PLT 302   Basic Metabolic Panel: Recent Labs  Lab 12/24/18 2001  NA 137  K 4.0  CL 104  CO2 23  GLUCOSE 138*  BUN 13  CREATININE 0.87  CALCIUM 10.0      No results for input(s): AST, ALT, ALKPHOS, BILITOT, PROT, ALBUMIN in the last 168 hours. No results for input(s): LIPASE, AMYLASE in the last 168 hours. No results for input(s): AMMONIA in the last 168 hours.    HbA1C: No results for input(s): HGBA1C in the last 72 hours. CBG: No results for input(s): GLUCAP in the last 168 hours.    Urine analysis:    Component Value Date/Time   COLORURINE YELLOW 01/22/2015 1130   APPEARANCEUR CLEAR 01/22/2015 1130   LABSPEC 1.009 01/22/2015 1130   PHURINE 6.0 01/22/2015 1130   GLUCOSEU NEGATIVE 01/22/2015 1130   HGBUR SMALL (A) 01/22/2015 1130   BILIRUBINUR NEGATIVE 01/22/2015 1130   KETONESUR 15 (A) 01/22/2015 1130   PROTEINUR NEGATIVE 01/22/2015 1130   UROBILINOGEN 0.2 01/22/2015 1130   NITRITE NEGATIVE 01/22/2015 1130   LEUKOCYTESUR NEGATIVE 01/22/2015 1130       Cultures:    Component Value Date/Time   SDES BLOOD LEFT ANTECUBITAL 02/20/2013 1540   SPECREQUEST BOTTLES DRAWN AEROBIC AND ANAEROBIC 10CC 02/20/2013 1540   CULT NO GROWTH 5 DAYS 02/20/2013 1540   REPTSTATUS 02/26/2013 FINAL  02/20/2013 1540     Radiological Exams on Admission: Dg Chest 1 View  Result Date: 12/24/2018 CLINICAL DATA:  Fall, preop EXAM: CHEST  1 VIEW COMPARISON:  01/22/2015 FINDINGS: There is hyperinflation of the lungs compatible with COPD. Cardiomegaly with vascular congestion. No overt edema. Suspect small effusions. No confluent opacities or acute bony abnormality. IMPRESSION: COPD. Cardiomegaly, vascular congestion.  Suspect small effusions. Electronically Signed   By: Charlett NoseKevin  Dover M.D.   On: 12/24/2018 19:59   Dg Hip Unilat With Pelvis 2-3 Views Left  Result Date: 12/24/2018 CLINICAL DATA:  Fall with persistent pain since fall. EXAM: DG HIP (WITH OR WITHOUT PELVIS) 2-3V LEFT COMPARISON:  None. FINDINGS: Significantly displaced fracture of the LEFT femoral neck, intertrochanteric, with nearly 90 degree angulation deformity at the fracture site. Femoral head remains appropriately positioned relative to the acetabulum. No additional fracture seen within the osseous pelvis or about the RIGHT hip. Soft tissues about the pelvis and LEFT hip are unremarkable. IMPRESSION: Significantly displaced fracture of the LEFT femoral neck, intertrochanteric, with nearly 90 degree angulation deformity at the fracture site. Electronically Signed   By: Bary RichardStan  Maynard M.D.   On: 12/24/2018 18:40    Chart has been reviewed    Assessment/Plan  85 y.o. female with medical history significant of htn, blind in left eye   Admitted for left fem neck fracture  Present on Admission: . Closed left hip fracture (HCC) -  - management as per orthopedics,  plan to operate  in  A.m. at 11 am   Keep nothing by mouth post midnight. Patient not on anticoagulation or antiplatelet agents   Ordered type and screen, Place Foley, order a vitamin D level  Patient at baseline able to walk a flight of stairs or 100 feet      Patient denies any chest pain or shortness of breath currently and/or with exertion,   ECG showing no  evidence of acute ischemia  no known history of coronary artery disease, cOPD  Liver failure   CKD  Given advanced age patient is at least moderate  risk  which has been discussed with family   at this point GIVEN NEW DIAGNOSIS OF CARDIOMEGALY AND HEART MURMUR WOULD OBTAIN echo   will order echo     . Cardiomegaly - CXR showing vasc conjestion, order Echo . Hypothyroidism - resume home meds when able to tolerate . Essential hypertension - resume Toprolol when able to take PO   Other plan as per orders.  DVT prophylaxis:  SCD    Code Status:  FULL CODE  as per  family  I had personally discussed CODE STATUS with patient and family   Family Communication:   Family  at  Bedside  plan of care was discussed with Son, Daughter in Social workerlaw ,   Disposition Plan:     likely will need placement for rehabilitation                                                        Swallow eval - SLP ordered                   Social Work  consulted                                        Consults called:  orthopedics  Admission status:  inpatient     Expect 2 midnight stay secondary to severity of patient's current illness including   Severe lab/radiological/exam abnormalities including:  Left hip fracture     That are currently affecting medical management.   I expect  patient to be hospitalized for 2 midnights requiring inpatient medical care.  Patient is at high risk for adverse outcome (such as loss of life or disability) if not treated.  Indication for inpatient stay as follows:    inability to maintain oral hydration    Need for operative/procedural  intervention    Need for   IV pain medications     Level of care    medical floor    Therisa Doynenastassia Charlestine Rookstool 12/24/2018, 11:33 PM    [facility]     after 2 AM please page floor coverage PA If 7AM-7PM, please contact the day team taking care of the patient using [email or web address]

## 2018-12-24 NOTE — ED Notes (Signed)
Consent for surgery signed and at bedside  

## 2018-12-24 NOTE — Consult Note (Signed)
ORTHOPAEDIC CONSULTATION HISTORY & PHYSICAL REQUESTING PHYSICIAN: Tilden Fossa, MD  Chief Complaint: Left hip pain  HPI: Caroline Ramsey is a 85 y.o. female who developed left hip pain several days ago, prompting evaluation elsewhere.  By report, x-rays were obtained which were unremarkable.  However, last night she had a new episode of pain with inability to bear weight, prompting evaluation today.  She speaks very limited [place], and today's encounter is facilitated via family present.  At baseline, before the onset of this pain several days ago, she is reported to ambulate without assistive devices.  Past Medical History:  Diagnosis Date  . Blind left eye   . High cholesterol   . Hypothyroidism   . Pneumonia 02/20/2013   LLL   Past Surgical History:  Procedure Laterality Date  . CATARACT EXTRACTION, BILATERAL  2006  . LARYNX SURGERY  ~ 2000   "couldn't speak; had to have surgery" (02/20/2013)   Social History   Socioeconomic History  . Marital status: Widowed    Spouse name: Not on file  . Number of children: Not on file  . Years of education: Not on file  . Highest education level: Not on file  Occupational History  . Not on file  Social Needs  . Financial resource strain: Not on file  . Food insecurity:    Worry: Not on file    Inability: Not on file  . Transportation needs:    Medical: Not on file    Non-medical: Not on file  Tobacco Use  . Smoking status: Never Smoker  . Smokeless tobacco: Never Used  Substance and Sexual Activity  . Alcohol use: No  . Drug use: No  . Sexual activity: Never  Lifestyle  . Physical activity:    Days per week: Not on file    Minutes per session: Not on file  . Stress: Not on file  Relationships  . Social connections:    Talks on phone: Not on file    Gets together: Not on file    Attends religious service: Not on file    Active member of club or organization: Not on file    Attends meetings of clubs or organizations: Not  on file    Relationship status: Not on file  Other Topics Concern  . Not on file  Social History Narrative  . Not on file   No family history on file. No Known Allergies Prior to Admission medications   Medication Sig Start Date End Date Taking? Authorizing Provider  alendronate (FOSAMAX) 70 MG tablet Take 70 mg by mouth every 7 (seven) days. Take with a full glass of water on an empty stomach.    [provider]  amLODipine (NORVASC) 2.5 MG tablet Take 2.5 mg by mouth daily.    [provider]  azithromycin (ZITHROMAX) 250 MG tablet One PO daily x 4 days Patient not taking: Reported on 01/22/2015 02/22/13   Orpah Cobb, MD  benzonatate (TESSALON) 100 MG capsule Take 1 capsule (100 mg total) by mouth every 8 (eight) hours. 01/22/15   Purvis Sheffield, MD  levothyroxine (SYNTHROID, LEVOTHROID) 50 MCG tablet Take 50 mcg by mouth See admin instructions. Monday through Friday    [provider]  levothyroxine (SYNTHROID, LEVOTHROID) 75 MCG tablet Take 75 mcg by mouth 2 (two) times a week.    [provider]  metoprolol succinate (TOPROL-XL) 25 MG 24 hr tablet Take 1 tablet (25 mg total) by mouth daily. Patient not taking: Reported on  01/22/2015 02/22/13   Orpah CobbKadakia, Ajay, MD  moxifloxacin (AVELOX) 400 MG tablet Take 1 tablet (400 mg total) by mouth daily. Patient not taking: Reported on 01/22/2015 02/22/13   Orpah CobbKadakia, Ajay, MD  simvastatin (ZOCOR) 10 MG tablet Take 10 mg by mouth at bedtime.    [provider]   Dg Chest 1 View  Result Date: 12/24/2018 CLINICAL DATA:  Fall, preop EXAM: CHEST  1 VIEW COMPARISON:  01/22/2015 FINDINGS: There is hyperinflation of the lungs compatible with COPD. Cardiomegaly with vascular congestion. No overt edema. Suspect small effusions. No confluent opacities or acute bony abnormality. IMPRESSION: COPD. Cardiomegaly, vascular congestion.  Suspect small effusions. Electronically Signed   By: Charlett NoseKevin  Dover M.D.   On: 12/24/2018  19:59   Dg Hip Unilat With Pelvis 2-3 Views Left  Result Date: 12/24/2018 CLINICAL DATA:  Fall with persistent pain since fall. EXAM: DG HIP (WITH OR WITHOUT PELVIS) 2-3V LEFT COMPARISON:  None. FINDINGS: Significantly displaced fracture of the LEFT femoral neck, intertrochanteric, with nearly 90 degree angulation deformity at the fracture site. Femoral head remains appropriately positioned relative to the acetabulum. No additional fracture seen within the osseous pelvis or about the RIGHT hip. Soft tissues about the pelvis and LEFT hip are unremarkable. IMPRESSION: Significantly displaced fracture of the LEFT femoral neck, intertrochanteric, with nearly 90 degree angulation deformity at the fracture site. Electronically Signed   By: Bary RichardStan  Maynard M.D.   On: 12/24/2018 18:40    Positive ROS: All other systems have been reviewed and were otherwise negative with the exception of those mentioned in the HPI and as above.  Physical Exam: Vitals: Refer to EMR. Constitutional: No acute distress, lying reasonably comfortably HEENT:  NCAT, EOMI Neuro/Psych:  Alert & oriented to person, place, and time; appropriate mood & affect Lymphatic: No generalized extremity edema or lymphadenopathy Extremities / MSK:  The extremities are normal with respect to appearance, ranges of motion, joint stability, muscle strength/tone, sensation, & perfusion except as otherwise noted:  Left leg lies externally rotated slightly shortened.  Pain with passive motion of the hip.  No pain with palpation of the knee.  Intact light touch sensation on the plantar and dorsal aspects of the foot including the first webspace.  Positive plantarflexion/dorsiflexion of the great toe and ankle.  Dorsalis pedis pulse palpable.  Foot is warm.  Assessment: Displaced left proximal femur fracture, appears to be basicervical, but CT scan is pending  Plan: I discussed these findings with her and her family.  I recommended operative treatment  with hip hemiarthroplasty.  Goals, risks, and options were reviewed and consent obtained.  I indicated that it would either be myself or 1 of my partners, hopefully tomorrow during the mid day, but if not then by tomorrow evening. No Lovenox, SCDs for VTE prophylaxis, n.p.o. after midnight.  Cliffton Astersavid A. Janee Mornhompson, MD      Orthopaedic & Hand Surgery [facility] [address] [place], [place]  [id number] Office: [phone] Mobile: [phone]  12/24/2018, 8:03 PM

## 2018-12-24 NOTE — ED Triage Notes (Signed)
Pt in c/o L hip and L leg pain, pts family reports being seen here x 2 days ago after fall with xrays not revealing a fracture per family, pt here today with continued pain to L leg and inability to ambulate, pt does not take blood thinners, pt A&O x4

## 2018-12-24 NOTE — ED Provider Notes (Signed)
[facility] EMERGENCY DEPARTMENT Provider Note   CSN: [id number] Arrival date & time: 12/24/18  1423     History   Chief Complaint Chief Complaint  Patient presents with  . Gait Problem    HPI Caroline Ramsey is a 85 y.o. female.  The history is provided by the patient, the spouse and a relative. No language interpreter was used.   Caroline Ramsey is a 85 y.o. female who presents to the Emergency Department complaining of difficulty walking. The five caveat due to language barrier. History is provided by the patient, husband and grandson. On January 29 she had a fall down a stop and landed on her left hip. She was seen at [facility] about five or six days ago and had x-rays performed at that time that were negative for fracture. She reports pain in her left hip. Her pain became acutely worse last night and she is been unable to stand since then. No recurrent falls. She has a history of hypertension, hypothyroidism. Symptoms are moderate and constant and worsening. Past Medical History:  Diagnosis Date  . Blind left eye   . High cholesterol   . Hypothyroidism   . Pneumonia 02/20/2013   LLL    Patient Active Problem List   Diagnosis Date Noted  . Closed left hip fracture (HCC) 12/24/2018  . Cardiomegaly 12/24/2018  . Hypothyroidism 12/24/2018  . Essential hypertension 12/24/2018    Past Surgical History:  Procedure Laterality Date  . CATARACT EXTRACTION, BILATERAL  2006  . LARYNX SURGERY  ~ 2000   "couldn't speak; had to have surgery" (02/20/2013)     OB History   No obstetric history on file.      Home Medications    Prior to Admission medications   Medication Sig Start Date End Date Taking? Authorizing Provider  levothyroxine (SYNTHROID, LEVOTHROID) 75 MCG tablet Take 75 mcg by mouth daily before breakfast.   Yes [provider]  metoprolol succinate (TOPROL-XL) 25 MG 24 hr tablet Take 25 mg by mouth daily.   Yes [provider]  omeprazole (PRILOSEC) 20 MG capsule Take 20 mg by mouth daily.   Yes [provider]  simvastatin (ZOCOR) 10 MG tablet Take 10 mg by mouth daily.   Yes [provider]    Family History Family History  Problem Relation Age of Onset  . Diabetes Other   . Hypertension Neg Hx   . CAD Neg Hx     Social History Social History   Tobacco Use  . Smoking status: Never Smoker  . Smokeless tobacco: Never Used  Substance Use Topics  . Alcohol use: No  . Drug use: No     Allergies   Patient has no known allergies.   Review of Systems Review of Systems  All other systems reviewed and are negative.    Physical Exam Updated Vital Signs BP (!) 158/54   Pulse 84   Temp 98.5 F (36.9 C) (Oral)   Resp (!) 24   Ht 4\' 9"  (1.448 m)   Wt 49.9 kg   SpO2 94%   BMI 23.80 kg/m   Physical Exam Vitals signs and nursing note reviewed.  Constitutional:      Appearance: She is well-developed.  HENT:     Head: Normocephalic and atraumatic.  Cardiovascular:     Rate and Rhythm: Normal rate and regular rhythm.     Heart sounds: No murmur.  Pulmonary:     Effort: Pulmonary effort  is normal. No respiratory distress.     Breath sounds: Normal breath sounds.  Abdominal:     Palpations: Abdomen is soft.     Tenderness: There is no abdominal tenderness. There is no guarding or rebound.  Musculoskeletal:     Comments: 2+ DP pulses bilaterally. There is tenderness to palpation to the left hip. Left lower extremity is externally rotated and shorten.  Skin:    General: Skin is warm and dry.  Neurological:     Mental Status: She is alert and oriented to person, place, and time.  Psychiatric:        Behavior: Behavior normal.      ED Treatments / Results  Labs (all labs ordered are listed, but only abnormal results are displayed) Labs Reviewed  BASIC METABOLIC PANEL - Abnormal; Notable for the following components:      Result Value   Glucose, Bld  138 (*)    GFR calc non Af Amer 59 (*)    All other components within normal limits  CBC WITH DIFFERENTIAL/PLATELET - Abnormal; Notable for the following components:   WBC 14.6 (*)    Neutro Abs 12.3 (*)    All other components within normal limits  PROTIME-INR  URINALYSIS, ROUTINE W REFLEX MICROSCOPIC  TYPE AND SCREEN  ABO/RH    EKG EKG Interpretation  Date/Time:  Monday December 24 2018 20:13:24 EST Ventricular Rate:  71 PR Interval:    QRS Duration: 77 QT Interval:  423 QTC Calculation: 460 R Axis:   29 Text Interpretation:  Sinus rhythm Artifact in lead(s) I II III aVR aVL aVF V1 V2 Otherwise within normal limits When compared with ECG of 01/22/2015, No significant change was found Confirmed by Dione BoozeGlick, David ([id number]) on 12/25/2018 12:00:03 AM   Radiology Dg Chest 1 View  Result Date: 12/24/2018 CLINICAL DATA:  Fall, preop EXAM: CHEST  1 VIEW COMPARISON:  01/22/2015 FINDINGS: There is hyperinflation of the lungs compatible with COPD. Cardiomegaly with vascular congestion. No overt edema. Suspect small effusions. No confluent opacities or acute bony abnormality. IMPRESSION: COPD. Cardiomegaly, vascular congestion.  Suspect small effusions. Electronically Signed   By: Charlett NoseKevin  Dover M.D.   On: 12/24/2018 19:59   Ct Hip Left Wo Contrast  Result Date: 12/24/2018 CLINICAL DATA:  Hip trauma.  Fracture. EXAM: CT OF THE LEFT HIP WITHOUT CONTRAST TECHNIQUE: Multidetector CT imaging of the left hip was performed according to the standard protocol. Multiplanar CT image reconstructions were also generated. COMPARISON:  Plain films 12/24/2018 FINDINGS: There is a left femoral neck fracture, basicervical with varus angulation. No subluxation or dislocation within the left hip. Mild degenerative changes within the left hip. No soft tissue abnormality within the visualized intra-abdominal space. IMPRESSION: Left femoral neck fracture with varus angulation as seen on prior plain films. Electronically  Signed   By: Charlett NoseKevin  Dover M.D.   On: 12/24/2018 22:59   Dg Hip Unilat With Pelvis 2-3 Views Left  Result Date: 12/24/2018 CLINICAL DATA:  Fall with persistent pain since fall. EXAM: DG HIP (WITH OR WITHOUT PELVIS) 2-3V LEFT COMPARISON:  None. FINDINGS: Significantly displaced fracture of the LEFT femoral neck, intertrochanteric, with nearly 90 degree angulation deformity at the fracture site. Femoral head remains appropriately positioned relative to the acetabulum. No additional fracture seen within the osseous pelvis or about the RIGHT hip. Soft tissues about the pelvis and LEFT hip are unremarkable. IMPRESSION: Significantly displaced fracture of the LEFT femoral neck, intertrochanteric, with nearly 90 degree angulation deformity at the fracture  site. Electronically Signed   By: Bary RichardStan  Maynard M.D.   On: 12/24/2018 18:40    Procedures Procedures (including critical care time)  Medications Ordered in ED Medications  fentaNYL (SUBLIMAZE) injection 50 mcg (50 mcg Intravenous Given 12/24/18 2049)     Initial Impression / Assessment and Plan / ED Course  I have reviewed the triage vital signs and the nursing notes.  Pertinent labs & imaging results that were available during my care of the patient were reviewed by me and considered in my medical decision making (see chart for details).     Patient presents for evaluation of left hip pain following a fall that occurred on January 29, now unable to ambulate. She has a left femoral neck fracture. She is neurovascularly intact on examination. Discussed with Dr. Janee Mornhompson with orthopedics, who will see the patient and consult. Medicine consulted for admission for further treatment.  Final Clinical Impressions(s) / ED Diagnoses   Final diagnoses:  None    ED Discharge Orders    None       Tilden Fossaees, Nyemah Watton, MD 12/25/18 [id number]

## 2018-12-25 ENCOUNTER — Inpatient Hospital Stay (HOSPITAL_COMMUNITY): Payer: Medicare Other

## 2018-12-25 ENCOUNTER — Inpatient Hospital Stay (HOSPITAL_COMMUNITY): Payer: Medicare Other | Admitting: Anesthesiology

## 2018-12-25 ENCOUNTER — Encounter (HOSPITAL_COMMUNITY)
Admission: EM | Disposition: A | Discharge status: Home Health | Payer: Self-pay | Source: Home / Self Care | Attending: Internal Medicine

## 2018-12-25 DIAGNOSIS — I517 Cardiomegaly: Secondary | ICD-10-CM

## 2018-12-25 HISTORY — PX: HIP ARTHROPLASTY: SHX981

## 2018-12-25 LAB — URINALYSIS, ROUTINE W REFLEX MICROSCOPIC
Bacteria, UA: NONE SEEN
Bilirubin Urine: NEGATIVE
Glucose, UA: NEGATIVE mg/dL
Ketones, ur: 5 mg/dL — AB
Leukocytes, UA: NEGATIVE
Nitrite: NEGATIVE
Protein, ur: NEGATIVE mg/dL
Specific Gravity, Urine: 1.023 (ref 1.005–1.030)
pH: 6 (ref 5.0–8.0)

## 2018-12-25 LAB — COMPREHENSIVE METABOLIC PANEL
ALK PHOS: 78 U/L (ref 38–126)
ALT: 13 U/L (ref 0–44)
AST: 21 U/L (ref 15–41)
Albumin: 2.9 g/dL — ABNORMAL LOW (ref 3.5–5.0)
Anion gap: 8 (ref 5–15)
BUN: 13 mg/dL (ref 8–23)
CALCIUM: 9.4 mg/dL (ref 8.9–10.3)
CO2: 24 mmol/L (ref 22–32)
Chloride: 105 mmol/L (ref 98–111)
Creatinine, Ser: 0.9 mg/dL (ref 0.44–1.00)
GFR calc Af Amer: >60 mL/min (ref >60)
GFR calc non Af Amer: 57 mL/min — ABNORMAL LOW (ref >60)
Glucose, Bld: 127 mg/dL — ABNORMAL HIGH (ref 70–99)
Potassium: 3.8 mmol/L (ref 3.5–5.1)
Sodium: 137 mmol/L (ref 135–145)
TOTAL PROTEIN: 7.1 g/dL (ref 6.5–8.1)
Total Bilirubin: 0.8 mg/dL (ref 0.3–1.2)

## 2018-12-25 LAB — CBC
HCT: 38.2 % (ref 36.0–46.0)
Hemoglobin: 12.7 g/dL (ref 12.0–15.0)
MCH: 30.4 pg (ref 26.0–34.0)
MCHC: 33.2 g/dL (ref 30.0–36.0)
MCV: 91.4 fL (ref 80.0–100.0)
Platelets: 292 10*3/uL (ref 150–400)
RBC: 4.18 MIL/uL (ref 3.87–5.11)
RDW: 13.2 % (ref 11.5–15.5)
WBC: 12.9 10*3/uL — AB (ref 4.0–10.5)
nRBC: 0 % (ref 0.0–0.2)

## 2018-12-25 LAB — ECHOCARDIOGRAM COMPLETE
Height: 57 in
Weight: 1760 oz

## 2018-12-25 SURGERY — HEMIARTHROPLASTY, HIP, DIRECT ANTERIOR APPROACH, FOR FRACTURE
Anesthesia: General | Site: Hip | Laterality: Left

## 2018-12-25 MED ORDER — FLEET ENEMA 7-19 GM/118ML RE ENEM: 1.0000 | ENEMA | Freq: Once | RECTAL | Status: DC | PRN

## 2018-12-25 MED ORDER — SIMVASTATIN 20 MG PO TABS
10.0000 mg | ORAL_TABLET | Freq: Every day | ORAL | Status: DC
  Administered 2018-12-26 – 2018-12-28 (×3): 10 mg via ORAL
  Filled 2018-12-25 (×3): qty 1

## 2018-12-25 MED ORDER — ENOXAPARIN SODIUM 40 MG/0.4ML ~~LOC~~ SOLN: 40.0000 mg | SUBCUTANEOUS | 0 refills | Status: DC

## 2018-12-25 MED ORDER — HYDROCODONE-ACETAMINOPHEN 5-325 MG PO TABS: 1.0000 | ORAL_TABLET | Freq: Three times a day (TID) | ORAL | 0 refills | Status: AC | PRN

## 2018-12-25 MED ORDER — METOPROLOL SUCCINATE ER 25 MG PO TB24
25.0000 mg | ORAL_TABLET | Freq: Every day | ORAL | Status: DC
  Administered 2018-12-26 – 2018-12-28 (×3): 25 mg via ORAL
  Filled 2018-12-25 (×3): qty 1

## 2018-12-25 MED ORDER — LEVOTHYROXINE SODIUM 75 MCG PO TABS
75.0000 ug | ORAL_TABLET | Freq: Every day | ORAL | Status: DC
  Administered 2018-12-26 – 2018-12-28 (×3): 75 ug via ORAL
  Filled 2018-12-25 (×3): qty 1

## 2018-12-25 MED ORDER — SODIUM CHLORIDE 0.9 % IV SOLN
INTRAVENOUS | Status: DC | PRN
  Administered 2018-12-25 (×2): via INTRAVENOUS

## 2018-12-25 MED ORDER — MORPHINE SULFATE (PF) 2 MG/ML IV SOLN: 0.2500 mg | INTRAVENOUS | Status: DC | PRN

## 2018-12-25 MED ORDER — DEXAMETHASONE SODIUM PHOSPHATE 10 MG/ML IJ SOLN
INTRAMUSCULAR | Status: DC | PRN
  Administered 2018-12-25: 10 mg via INTRAVENOUS

## 2018-12-25 MED ORDER — OXYCODONE HCL 5 MG PO TABS: 5.0000 mg | ORAL_TABLET | Freq: Once | ORAL | Status: DC | PRN

## 2018-12-25 MED ORDER — FENTANYL CITRATE (PF) 250 MCG/5ML IJ SOLN
INTRAMUSCULAR | Status: DC | PRN
  Administered 2018-12-25 (×2): 50 ug via INTRAVENOUS

## 2018-12-25 MED ORDER — ACETAMINOPHEN 325 MG PO TABS: 325.0000 mg | ORAL_TABLET | ORAL | Status: DC | PRN

## 2018-12-25 MED ORDER — POLYETHYLENE GLYCOL 3350 17 G PO PACK: 17.0000 g | PACK | Freq: Every day | ORAL | Status: DC | PRN

## 2018-12-25 MED ORDER — POLYETHYLENE GLYCOL 3350 17 G PO PACK
17.0000 g | PACK | Freq: Every day | ORAL | Status: DC | PRN
  Filled 2018-12-25: qty 1

## 2018-12-25 MED ORDER — MORPHINE SULFATE (PF) 2 MG/ML IV SOLN: 0.5000 mg | INTRAVENOUS | Status: DC | PRN

## 2018-12-25 MED ORDER — METOCLOPRAMIDE HCL 5 MG/ML IJ SOLN: 5.0000 mg | Freq: Three times a day (TID) | INTRAMUSCULAR | Status: DC | PRN

## 2018-12-25 MED ORDER — SODIUM CHLORIDE 0.9 % IR SOLN
Status: DC | PRN
  Administered 2018-12-25: 1000 mL

## 2018-12-25 MED ORDER — CEFAZOLIN SODIUM-DEXTROSE 2-4 GM/100ML-% IV SOLN
2.0000 g | INTRAVENOUS | Status: AC
  Administered 2018-12-25: 2 g via INTRAVENOUS
  Filled 2018-12-25: qty 100

## 2018-12-25 MED ORDER — ROCURONIUM BROMIDE 50 MG/5ML IV SOSY
PREFILLED_SYRINGE | INTRAVENOUS | Status: AC
  Filled 2018-12-25: qty 5

## 2018-12-25 MED ORDER — FENTANYL CITRATE (PF) 250 MCG/5ML IJ SOLN
INTRAMUSCULAR | Status: AC
  Filled 2018-12-25: qty 5

## 2018-12-25 MED ORDER — METHOCARBAMOL 500 MG PO TABS
500.0000 mg | ORAL_TABLET | Freq: Four times a day (QID) | ORAL | Status: DC | PRN
  Administered 2018-12-26 – 2018-12-27 (×2): 500 mg via ORAL
  Filled 2018-12-25 (×2): qty 1

## 2018-12-25 MED ORDER — FENTANYL CITRATE (PF) 100 MCG/2ML IJ SOLN: 25.0000 ug | INTRAMUSCULAR | Status: DC | PRN

## 2018-12-25 MED ORDER — POVIDONE-IODINE 10 % EX SWAB: 2.0000 "application " | Freq: Once | CUTANEOUS | Status: DC

## 2018-12-25 MED ORDER — ACETAMINOPHEN 500 MG PO TABS
500.0000 mg | ORAL_TABLET | Freq: Four times a day (QID) | ORAL | Status: AC
  Administered 2018-12-25 – 2018-12-26 (×3): 500 mg via ORAL
  Filled 2018-12-25 (×3): qty 1

## 2018-12-25 MED ORDER — PROPOFOL 1000 MG/100ML IV EMUL
INTRAVENOUS | Status: AC
  Filled 2018-12-25: qty 100

## 2018-12-25 MED ORDER — BUPIVACAINE HCL (PF) 0.25 % IJ SOLN
INTRAMUSCULAR | Status: DC | PRN
  Administered 2018-12-25: 30 mL

## 2018-12-25 MED ORDER — HYDROCODONE-ACETAMINOPHEN 5-325 MG PO TABS: 1.0000 | ORAL_TABLET | Freq: Four times a day (QID) | ORAL | Status: DC | PRN

## 2018-12-25 MED ORDER — PHENYLEPHRINE 40 MCG/ML (10ML) SYRINGE FOR IV PUSH (FOR BLOOD PRESSURE SUPPORT)
PREFILLED_SYRINGE | INTRAVENOUS | Status: DC | PRN
  Administered 2018-12-25: 80 ug via INTRAVENOUS

## 2018-12-25 MED ORDER — CEFAZOLIN SODIUM-DEXTROSE 1-4 GM/50ML-% IV SOLN
1.0000 g | Freq: Once | INTRAVENOUS | Status: AC
  Administered 2018-12-26: 1 g via INTRAVENOUS
  Filled 2018-12-25: qty 50

## 2018-12-25 MED ORDER — ONDANSETRON HCL 4 MG PO TABS: 4.0000 mg | ORAL_TABLET | Freq: Four times a day (QID) | ORAL | Status: DC | PRN

## 2018-12-25 MED ORDER — HYDROCODONE-ACETAMINOPHEN 5-325 MG PO TABS
1.0000 | ORAL_TABLET | ORAL | Status: DC | PRN
  Administered 2018-12-26 – 2018-12-27 (×4): 1 via ORAL
  Filled 2018-12-25 (×4): qty 1

## 2018-12-25 MED ORDER — MEPERIDINE HCL 50 MG/ML IJ SOLN: 6.2500 mg | INTRAMUSCULAR | Status: DC | PRN

## 2018-12-25 MED ORDER — LIDOCAINE 2% (20 MG/ML) 5 ML SYRINGE
INTRAMUSCULAR | Status: DC | PRN
  Administered 2018-12-25: 40 mg via INTRAVENOUS

## 2018-12-25 MED ORDER — PROPOFOL 10 MG/ML IV BOLUS
INTRAVENOUS | Status: AC
  Filled 2018-12-25: qty 20

## 2018-12-25 MED ORDER — OXYCODONE HCL 5 MG/5ML PO SOLN: 5.0000 mg | Freq: Once | ORAL | Status: DC | PRN

## 2018-12-25 MED ORDER — BISACODYL 10 MG RE SUPP: 10.0000 mg | Freq: Every day | RECTAL | Status: DC | PRN

## 2018-12-25 MED ORDER — TRANEXAMIC ACID-NACL 1000-0.7 MG/100ML-% IV SOLN
INTRAVENOUS | Status: AC
  Filled 2018-12-25: qty 100

## 2018-12-25 MED ORDER — EPHEDRINE SULFATE-NACL 50-0.9 MG/10ML-% IV SOSY
PREFILLED_SYRINGE | INTRAVENOUS | Status: DC | PRN
  Administered 2018-12-25 (×3): 10 mg via INTRAVENOUS

## 2018-12-25 MED ORDER — METOCLOPRAMIDE HCL 5 MG PO TABS: 5.0000 mg | ORAL_TABLET | Freq: Three times a day (TID) | ORAL | Status: DC | PRN

## 2018-12-25 MED ORDER — LIDOCAINE 2% (20 MG/ML) 5 ML SYRINGE
INTRAMUSCULAR | Status: AC
  Filled 2018-12-25: qty 10

## 2018-12-25 MED ORDER — TRANEXAMIC ACID-NACL 1000-0.7 MG/100ML-% IV SOLN
INTRAVENOUS | Status: DC | PRN
  Administered 2018-12-25: 1000 mg via INTRAVENOUS

## 2018-12-25 MED ORDER — PROPOFOL 10 MG/ML IV BOLUS
INTRAVENOUS | Status: DC | PRN
  Administered 2018-12-25: 100 mg via INTRAVENOUS

## 2018-12-25 MED ORDER — DOCUSATE SODIUM 100 MG PO CAPS
100.0000 mg | ORAL_CAPSULE | Freq: Two times a day (BID) | ORAL | Status: DC
  Administered 2018-12-26: 100 mg via ORAL
  Filled 2018-12-25 (×2): qty 1

## 2018-12-25 MED ORDER — BUPIVACAINE HCL (PF) 0.25 % IJ SOLN
INTRAMUSCULAR | Status: AC
  Filled 2018-12-25: qty 30

## 2018-12-25 MED ORDER — SODIUM CHLORIDE 0.9 % IV SOLN
INTRAVENOUS | Status: DC | PRN
  Administered 2018-12-25: 50 ug/min via INTRAVENOUS

## 2018-12-25 MED ORDER — CHLORHEXIDINE GLUCONATE 4 % EX LIQD: 60.0000 mL | Freq: Once | CUTANEOUS | Status: DC

## 2018-12-25 MED ORDER — ONDANSETRON HCL 4 MG/2ML IJ SOLN
INTRAMUSCULAR | Status: DC | PRN
  Administered 2018-12-25: 4 mg via INTRAVENOUS

## 2018-12-25 MED ORDER — ONDANSETRON HCL 4 MG/2ML IJ SOLN: 4.0000 mg | Freq: Four times a day (QID) | INTRAMUSCULAR | Status: DC | PRN

## 2018-12-25 MED ORDER — ROCURONIUM BROMIDE 50 MG/5ML IV SOSY
PREFILLED_SYRINGE | INTRAVENOUS | Status: DC | PRN
  Administered 2018-12-25: 40 mg via INTRAVENOUS

## 2018-12-25 MED ORDER — ONDANSETRON HCL 4 MG/2ML IJ SOLN: 4.0000 mg | Freq: Once | INTRAMUSCULAR | Status: DC | PRN

## 2018-12-25 MED ORDER — METHOCARBAMOL 1000 MG/10ML IJ SOLN: 500.0000 mg | Freq: Four times a day (QID) | INTRAVENOUS | Status: DC | PRN

## 2018-12-25 MED ORDER — ACETAMINOPHEN 160 MG/5ML PO SOLN: 325.0000 mg | ORAL | Status: DC | PRN

## 2018-12-25 MED ORDER — ENOXAPARIN SODIUM 40 MG/0.4ML ~~LOC~~ SOLN
40.0000 mg | SUBCUTANEOUS | Status: DC
  Administered 2018-12-26: 40 mg via SUBCUTANEOUS
  Filled 2018-12-25: qty 0.4

## 2018-12-25 MED ORDER — LACTATED RINGERS IV SOLN: INTRAVENOUS | Status: DC

## 2018-12-25 SURGICAL SUPPLY — 69 items
BLADE CLIPPER SURG (BLADE) IMPLANT
BLADE SAW SAG 73X25 THK (BLADE) ×1
BLADE SAW SGTL 73X25 THK (BLADE) ×2 IMPLANT
BLADE SURG 10 STRL SS (BLADE) ×3 IMPLANT
CHLORAPREP W/TINT 26ML (MISCELLANEOUS) ×3 IMPLANT
CLOSURE WOUND 1/2 X4 (GAUZE/BANDAGES/DRESSINGS) ×1
COVER SURGICAL LIGHT HANDLE (MISCELLANEOUS) ×3 IMPLANT
COVER WAND RF STERILE (DRAPES) ×3 IMPLANT
DRAPE HALF SHEET 40X57 (DRAPES) ×3 IMPLANT
DRAPE IMP U-DRAPE 54X76 (DRAPES) ×3 IMPLANT
DRAPE INCISE IOBAN 66X45 STRL (DRAPES) ×3 IMPLANT
DRAPE ORTHO SPLIT 77X108 STRL (DRAPES) ×4
DRAPE SURG ORHT 6 SPLT 77X108 (DRAPES) ×2 IMPLANT
DRAPE U-SHAPE 47X51 STRL (DRAPES) ×3 IMPLANT
DRESSING ALLEVYN LITE 5X5 NADH (GAUZE/BANDAGES/DRESSINGS) ×1 IMPLANT
DRILL BIT 7/64X5 (BIT) ×3 IMPLANT
DRSG ALLEVYN LITE 5X5 NADH (GAUZE/BANDAGES/DRESSINGS) ×3
DRSG MEPILEX BORDER 4X12 (GAUZE/BANDAGES/DRESSINGS) ×3 IMPLANT
ELECT BLADE 4.0 EZ CLEAN MEGAD (MISCELLANEOUS) ×3
ELECT BLADE 6.5 EXT (BLADE) ×3 IMPLANT
ELECT REM PT RETURN 9FT ADLT (ELECTROSURGICAL) ×3
ELECTRODE BLDE 4.0 EZ CLN MEGD (MISCELLANEOUS) ×1 IMPLANT
ELECTRODE REM PT RTRN 9FT ADLT (ELECTROSURGICAL) ×1 IMPLANT
EVACUATOR 1/8 PVC DRAIN (DRAIN) IMPLANT
GLOVE BIO SURGEON STRL SZ7 (GLOVE) ×3 IMPLANT
GLOVE BIO SURGEON STRL SZ7.5 (GLOVE) ×3 IMPLANT
GLOVE BIOGEL PI IND STRL 8 (GLOVE) ×1 IMPLANT
GLOVE BIOGEL PI INDICATOR 8 (GLOVE) ×2
GLOVE SS BIOGEL STRL SZ 8 (GLOVE) ×1 IMPLANT
GLOVE SUPERSENSE BIOGEL SZ 8 (GLOVE) ×2
GOWN STRL REUS W/ TWL LRG LVL3 (GOWN DISPOSABLE) ×3 IMPLANT
GOWN STRL REUS W/TWL LRG LVL3 (GOWN DISPOSABLE) ×6
HANDPIECE INTERPULSE COAX TIP (DISPOSABLE)
HEAD MODULAR ENDO (Orthopedic Implant) ×2 IMPLANT
HEAD UNPLR 42XMDLR STRL HIP (Orthopedic Implant) ×1 IMPLANT
HOOD PEEL AWAY FACE SHEILD DIS (HOOD) IMPLANT
IMMOBILIZER KNEE 22 UNIV (SOFTGOODS) ×3 IMPLANT
KIT BASIN OR (CUSTOM PROCEDURE TRAY) ×3 IMPLANT
KIT TURNOVER KIT B (KITS) ×3 IMPLANT
MANIFOLD NEPTUNE II (INSTRUMENTS) ×3 IMPLANT
NEEDLE 22X1 1/2 (OR ONLY) (NEEDLE) ×3 IMPLANT
NEEDLE MAYO TROCAR (NEEDLE) ×3 IMPLANT
NS IRRIG 1000ML POUR BTL (IV SOLUTION) ×3 IMPLANT
PACK TOTAL JOINT (CUSTOM PROCEDURE TRAY) ×3 IMPLANT
PACK UNIVERSAL I (CUSTOM PROCEDURE TRAY) ×3 IMPLANT
PAD ARMBOARD 7.5X6 YLW CONV (MISCELLANEOUS) ×6 IMPLANT
PASSER SUT SWANSON 36MM LOOP (INSTRUMENTS) IMPLANT
PRESSURIZER FEMORAL UNIV (MISCELLANEOUS) IMPLANT
RETRIEVER SUT HEWSON (MISCELLANEOUS) ×3 IMPLANT
SET HNDPC FAN SPRY TIP SCT (DISPOSABLE) IMPLANT
SLEEVE UNITRAX CTAPER (Orthopedic Implant) ×2 IMPLANT
SLEEVE UNITRAX CTAPER +5 (Orthopedic Implant) ×1 IMPLANT
STAPLER VISISTAT 35W (STAPLE) ×3 IMPLANT
STEM FEMORAL SZ 7 13X155 (Stem) ×3 IMPLANT
STRIP CLOSURE SKIN 1/2X4 (GAUZE/BANDAGES/DRESSINGS) ×2 IMPLANT
SUT ETHIBOND NAB CT1 #1 30IN (SUTURE) ×12 IMPLANT
SUT MNCRL AB 3-0 PS2 27 (SUTURE) ×3 IMPLANT
SUT STRATAFIX 1PDS 45CM VIOLET (SUTURE) ×3 IMPLANT
SUT VIC AB 0 CT1 27 (SUTURE) ×2
SUT VIC AB 0 CT1 27XBRD ANBCTR (SUTURE) ×1 IMPLANT
SUT VIC AB 1 CTB1 27 (SUTURE) ×6 IMPLANT
SUT VIC AB 2-0 CT1 27 (SUTURE) ×6
SUT VIC AB 2-0 CT1 TAPERPNT 27 (SUTURE) ×3 IMPLANT
SYR CONTROL 10ML LL (SYRINGE) ×3 IMPLANT
TOWEL OR 17X24 6PK STRL BLUE (TOWEL DISPOSABLE) ×3 IMPLANT
TOWEL OR 17X26 10 PK STRL BLUE (TOWEL DISPOSABLE) ×3 IMPLANT
TOWER CARTRIDGE SMART MIX (DISPOSABLE) IMPLANT
TRAY FOLEY MTR SLVR 16FR STAT (SET/KITS/TRAYS/PACK) IMPLANT
WATER STERILE IRR 1000ML POUR (IV SOLUTION) ×3 IMPLANT

## 2018-12-25 NOTE — Progress Notes (Signed)
Patient came down to Short Stay with granddaughter. Patient speaks a language we do not have available. Granddaughter provided pre op assessment information.

## 2018-12-25 NOTE — Consult Note (Signed)
ORTHOPAEDIC CONSULTATION  REQUESTING PHYSICIAN: Cristal Ford, DO  Chief Complaint: left hip fracture  HPI: I have reviewed and agreee with below history  Caroline Ramsey is a 85 y.o. female with medical history significant of htn, blind in left eye    Presented with   Fall Jan 29th she missed the last step and fell down was caught by her Yolanda Bonine but still fell and landed on her left hip did not hit her head no LOC. Plain imaging neg initially but continued to have hip pain presented today.  Left fem neck fracture.  she never reports any chest pain walks at baseline without assist No dyspnea, no known hx of CAD  Past Medical History:  Diagnosis Date  . Blind left eye   . High cholesterol   . Hypothyroidism   . Pneumonia 02/20/2013   LLL   Past Surgical History:  Procedure Laterality Date  . CATARACT EXTRACTION, BILATERAL  2006  . LARYNX SURGERY  ~ 2000   "couldn't speak; had to have surgery" (02/20/2013)   Social History   Socioeconomic History  . Marital status: Widowed    Spouse name: Not on file  . Number of children: Not on file  . Years of education: Not on file  . Highest education level: Not on file  Occupational History  . Not on file  Social Needs  . Financial resource strain: Not on file  . Food insecurity:    Worry: Not on file    Inability: Not on file  . Transportation needs:    Medical: Not on file    Non-medical: Not on file  Tobacco Use  . Smoking status: Never Smoker  . Smokeless tobacco: Never Used  Substance and Sexual Activity  . Alcohol use: No  . Drug use: No  . Sexual activity: Never  Lifestyle  . Physical activity:    Days per week: Not on file    Minutes per session: Not on file  . Stress: Not on file  Relationships  . Social connections:    Talks on phone: Not on file    Gets together: Not on file    Attends religious service: Not on file    Active member of club or organization: Not on file    Attends meetings of  clubs or organizations: Not on file    Relationship status: Not on file  Other Topics Concern  . Not on file  Social History Narrative  . Not on file   Family History  Problem Relation Age of Onset  . Diabetes Other   . Hypertension Neg Hx   . CAD Neg Hx    No Known Allergies Prior to Admission medications   Medication Sig Start Date End Date Taking? Authorizing Provider  levothyroxine (SYNTHROID, LEVOTHROID) 75 MCG tablet Take 75 mcg by mouth daily before breakfast.   Yes [provider]  metoprolol succinate (TOPROL-XL) 25 MG 24 hr tablet Take 25 mg by mouth daily.   Yes [provider]  omeprazole (PRILOSEC) 20 MG capsule Take 20 mg by mouth daily.   Yes [provider]  simvastatin (ZOCOR) 10 MG tablet Take 10 mg by mouth daily.   Yes [provider]   Dg Chest 1 View  Result Date: 12/24/2018 CLINICAL DATA:  Fall, preop EXAM: CHEST  1 VIEW COMPARISON:  01/22/2015 FINDINGS: There is hyperinflation of the lungs compatible with COPD. Cardiomegaly with vascular congestion. No overt edema. Suspect small effusions. No confluent opacities  or acute bony abnormality. IMPRESSION: COPD. Cardiomegaly, vascular congestion.  Suspect small effusions. Electronically Signed   By: Rolm Baptise M.D.   On: 12/24/2018 19:59   Ct Hip Left Wo Contrast  Result Date: 12/24/2018 CLINICAL DATA:  Hip trauma.  Fracture. EXAM: CT OF THE LEFT HIP WITHOUT CONTRAST TECHNIQUE: Multidetector CT imaging of the left hip was performed according to the standard protocol. Multiplanar CT image reconstructions were also generated. COMPARISON:  Plain films 12/24/2018 FINDINGS: There is a left femoral neck fracture, basicervical with varus angulation. No subluxation or dislocation within the left hip. Mild degenerative changes within the left hip. No soft tissue abnormality within the visualized intra-abdominal space. IMPRESSION: Left femoral neck fracture with varus angulation as seen on  prior plain films. Electronically Signed   By: Rolm Baptise M.D.   On: 12/24/2018 22:59   Dg Hip Unilat With Pelvis 2-3 Views Left  Result Date: 12/24/2018 CLINICAL DATA:  Fall with persistent pain since fall. EXAM: DG HIP (WITH OR WITHOUT PELVIS) 2-3V LEFT COMPARISON:  None. FINDINGS: Significantly displaced fracture of the LEFT femoral neck, intertrochanteric, with nearly 90 degree angulation deformity at the fracture site. Femoral head remains appropriately positioned relative to the acetabulum. No additional fracture seen within the osseous pelvis or about the RIGHT hip. Soft tissues about the pelvis and LEFT hip are unremarkable. IMPRESSION: Significantly displaced fracture of the LEFT femoral neck, intertrochanteric, with nearly 90 degree angulation deformity at the fracture site. Electronically Signed   By: Franki Cabot M.D.   On: 12/24/2018 18:40    Positive ROS: All other systems have been reviewed and were otherwise negative with the exception of those mentioned in the HPI and as above.  Labs cbc Recent Labs    12/24/18 2001 12/25/18 0312  WBC 14.6* 12.9*  HGB 12.2 12.7  HCT 37.9 38.2  PLT 302 292    Labs inflam No results for input(s): CRP in the last 72 hours.  Invalid input(s): ESR  Labs coag Recent Labs    12/24/18 2001  INR 1.02    Recent Labs    12/24/18 2001 12/25/18 0312  NA 137 137  K 4.0 3.8  CL 104 105  CO2 23 24  GLUCOSE 138* 127*  BUN 13 13  CREATININE 0.87 0.90  CALCIUM 10.0 9.4    Physical Exam: Vitals:   12/25/18 1130 12/25/18 1307  BP: (!) 164/97 (!) 156/66  Pulse: 79 77  Resp: 15 16  Temp:    SpO2: 99% 100%   General: Alert, no acute distress Cardiovascular: No pedal edema Respiratory: No cyanosis, no use of accessory musculature GI: No organomegaly, abdomen is soft and non-tender Skin: No lesions in the area of chief complaint other than those listed below in MSK exam.  Neurologic: Sensation intact distally save for the below  mentioned MSK exam Lymphatic: No axillary or cervical lymphadenopathy  MUSCULOSKELETAL:   Other extremities are atraumatic with painless ROM and NVI.  Assessment: Left hip fracture  Plan: Left hip hemiarthroplasty today I have discussed risks and benefits with family and patient and all wish to proceed.    Renette Butters, MD Cell [phone]   12/25/2018 1:19 PM

## 2018-12-25 NOTE — Anesthesia Postprocedure Evaluation (Signed)
Anesthesia Post Note  Patient: Caroline Ramsey  Procedure(s) Performed: LEFT HIP ARTHROPLASTY FOR FRACTURE (Left Hip)     Patient location during evaluation: PACU Anesthesia Type: General Level of consciousness: awake Pain management: pain level controlled Vital Signs Assessment: post-procedure vital signs reviewed and stable Respiratory status: spontaneous breathing Cardiovascular status: stable Postop Assessment: no apparent nausea or vomiting Anesthetic complications: no    Last Vitals:  Vitals:   12/25/18 1740 12/25/18 2033  BP: (!) 105/45 (!) 120/50  Pulse: 69 74  Resp: 20 12  Temp:    SpO2: 92% 96%    Last Pain:  Vitals:   12/25/18 2145  TempSrc:   PainSc: Asleep                 Lavonne Kinderman

## 2018-12-25 NOTE — Progress Notes (Signed)
PROGRESS NOTE    Caroline Ramsey  ZOX:[id number] DOB: 1927-08-18 DOA: 12/24/2018 [facility]   Brief Narrative:  HPI on 12/24/2018 by Dr. Therisa DoyneAnastassia Doutova Caroline Ramsey is a 85 y.o. female with medical history significant of htn, blind in left eye  Presented with   Fall Jan 29th she missed the last step and fell down was caught by her Lucila MaineGrandson but still fell and landed on her left hip did not hit her head no LOC. Plain imaging neg initially but continued to have hip pain presented today.  Left fem neck fracture.  she never reports any chest pain walks at baseline without assist No dyspnea, no known hx of CAD Assessment & Plan   Left femoral neck fracture  -Status post fall, on December 12, 2018 -CT scan showed left femoral neck fracture with varus angulation  -Orthopedics consulted and appreciated  -Echocardiogram as below  -Given patient's age, comorbidities, she is at least moderate risk for surgery  -Continue pain control with IV morphine as well as hydrocodone, Robaxin  Cardiomegaly/heart murmur -Echocardiogram obtained: LV function 6065%, LV diastolic dysfunction parameters consistent with impaired relaxation.  Elevated mean left atrial pressure.  No evidence of LV regional wall motion abnormalities.  Moderate aortic valve regurgitation. -No complaints of chest pain -Continue to monitor -We will likely need to follow-up with cardiology as an outpatient  Hypothyroidism -Continue Synthroid  Essential Hypertension -Continue metoprolol  Left eye blindness -Chronic  DVT Prophylaxis  SCDs  Code Status: Full  Family Communication: Family at bedside  Disposition Plan: Admitted.  Pending surgery today.  Would likely need SNF, however to be determined.  Consultants Orthopedics  Procedures  Echocardiogram  Antibiotics   Anti-infectives (From admission, onward)   Start     Dose/Rate Route Frequency Ordered Stop   12/25/18 0600  ceFAZolin (ANCEF) IVPB  2g/100 mL premix     2 g 200 mL/hr over 30 Minutes Intravenous On call to O.R. 12/25/18 0247 12/26/18 0559      Subjective:   Caroline AfricaKamlaben Sampath seen and examined today.  No complaints of chest pain, shortness of breath, abdominal pain, nausea or vomiting, diarrhea or constipation.  Does complain of some left hip pain with movement. Objective:   Vitals:   12/24/18 2045 12/24/18 2130 12/24/18 2200 12/25/18 0654  BP: (!) 169/56 (!) 159/55 (!) 158/54 (!) 146/51  Pulse: 64 (!) 59 84 66  Resp:  (!) 29 (!) 24 17  Temp:      TempSrc:      SpO2: 96% 95% 94% 98%  Weight:      Height:       No intake or output data in the 24 hours ending 12/25/18 0913 Filed Weights   12/24/18 1444  Weight: 49.9 kg    Exam  General: Well developed, elderly, chronically ill-appearing, NAD  HEENT: NCAT,  mucous membranes moist.   Neck: Supple  Cardiovascular: S1 S2 auscultated, 2/6 SEM, RRR  Respiratory: Clear to auscultation bilaterally with equal chest rise  Abdomen: Soft, nontender, nondistended, + bowel sounds  Extremities: warm dry without cyanosis clubbing or edema  Neuro: AAOx3, nonfocal  Psych: Appropriate mood and affect   Data Reviewed: I have personally reviewed following labs and imaging studies  CBC: Recent Labs  Lab 12/24/18 2001 12/25/18 0312  WBC 14.6* 12.9*  NEUTROABS 12.3*  --   HGB 12.2 12.7  HCT 37.9 38.2  MCV 90.7 91.4  PLT 302 292   Basic Metabolic Panel: Recent Labs  Lab  12/24/18 2001 12/25/18 0312  NA 137 137  K 4.0 3.8  CL 104 105  CO2 23 24  GLUCOSE 138* 127*  BUN 13 13  CREATININE 0.87 0.90  CALCIUM 10.0 9.4   GFR: Estimated Creatinine Clearance: 28.8 mL/min (by C-G formula based on SCr of 0.9 mg/dL). Liver Function Tests: Recent Labs  Lab 12/25/18 0312  AST 21  ALT 13  ALKPHOS 78  BILITOT 0.8  PROT 7.1  ALBUMIN 2.9*   No results for input(s): LIPASE, AMYLASE in the last 168 hours. No results for input(s): AMMONIA in the last 168  hours. Coagulation Profile: Recent Labs  Lab 12/24/18 2001  INR 1.02   Cardiac Enzymes: No results for input(s): CKTOTAL, CKMB, CKMBINDEX, TROPONINI in the last 168 hours. BNP (last 3 results) No results for input(s): PROBNP in the last 8760 hours. HbA1C: No results for input(s): HGBA1C in the last 72 hours. CBG: No results for input(s): GLUCAP in the last 168 hours. Lipid Profile: No results for input(s): CHOL, HDL, LDLCALC, TRIG, CHOLHDL, LDLDIRECT in the last 72 hours. Thyroid Function Tests: No results for input(s): TSH, T4TOTAL, FREET4, T3FREE, THYROIDAB in the last 72 hours. Anemia Panel: No results for input(s): VITAMINB12, FOLATE, FERRITIN, TIBC, IRON, RETICCTPCT in the last 72 hours. Urine analysis:    Component Value Date/Time   COLORURINE YELLOW 12/25/2018 0152   APPEARANCEUR CLEAR 12/25/2018 0152   LABSPEC 1.023 12/25/2018 0152   PHURINE 6.0 12/25/2018 0152   GLUCOSEU NEGATIVE 12/25/2018 0152   HGBUR SMALL (A) 12/25/2018 0152   BILIRUBINUR NEGATIVE 12/25/2018 0152   KETONESUR 5 (A) 12/25/2018 0152   PROTEINUR NEGATIVE 12/25/2018 0152   UROBILINOGEN 0.2 01/22/2015 1130   NITRITE NEGATIVE 12/25/2018 0152   LEUKOCYTESUR NEGATIVE 12/25/2018 0152   Sepsis Labs: @LABRCNTIP (procalcitonin:4,lacticidven:4)  )No results found for this or any previous visit (from the past 240 hour(s)).    Radiology Studies: Dg Chest 1 View  Result Date: 12/24/2018 CLINICAL DATA:  Fall, preop EXAM: CHEST  1 VIEW COMPARISON:  01/22/2015 FINDINGS: There is hyperinflation of the lungs compatible with COPD. Cardiomegaly with vascular congestion. No overt edema. Suspect small effusions. No confluent opacities or acute bony abnormality. IMPRESSION: COPD. Cardiomegaly, vascular congestion.  Suspect small effusions. Electronically Signed   By: Charlett NoseKevin  Dover M.D.   On: 12/24/2018 19:59   Ct Hip Left Wo Contrast  Result Date: 12/24/2018 CLINICAL DATA:  Hip trauma.  Fracture. EXAM: CT OF THE  LEFT HIP WITHOUT CONTRAST TECHNIQUE: Multidetector CT imaging of the left hip was performed according to the standard protocol. Multiplanar CT image reconstructions were also generated. COMPARISON:  Plain films 12/24/2018 FINDINGS: There is a left femoral neck fracture, basicervical with varus angulation. No subluxation or dislocation within the left hip. Mild degenerative changes within the left hip. No soft tissue abnormality within the visualized intra-abdominal space. IMPRESSION: Left femoral neck fracture with varus angulation as seen on prior plain films. Electronically Signed   By: Charlett NoseKevin  Dover M.D.   On: 12/24/2018 22:59   Dg Hip Unilat With Pelvis 2-3 Views Left  Result Date: 12/24/2018 CLINICAL DATA:  Fall with persistent pain since fall. EXAM: DG HIP (WITH OR WITHOUT PELVIS) 2-3V LEFT COMPARISON:  None. FINDINGS: Significantly displaced fracture of the LEFT femoral neck, intertrochanteric, with nearly 90 degree angulation deformity at the fracture site. Femoral head remains appropriately positioned relative to the acetabulum. No additional fracture seen within the osseous pelvis or about the RIGHT hip. Soft tissues about the pelvis and LEFT hip  are unremarkable. IMPRESSION: Significantly displaced fracture of the LEFT femoral neck, intertrochanteric, with nearly 90 degree angulation deformity at the fracture site. Electronically Signed   By: Bary Richard M.D.   On: 12/24/2018 18:40     Scheduled Meds: . [START ON 12/26/2018] levothyroxine  75 mcg Oral QAC breakfast  . [START ON 12/26/2018] metoprolol succinate  25 mg Oral Daily  . [START ON 12/26/2018] simvastatin  10 mg Oral Daily   Continuous Infusions: .  ceFAZolin (ANCEF) IV    . methocarbamol (ROBAXIN) IV       LOS: 1 day   Time Spent in minutes   30 minutes  Josephus Harriger D.O. on 12/25/2018 at 9:13 AM  Between 7am to 7pm - Please see pager noted on [email or web address]  After 7pm go to [email or web address]  And look for the night coverage  person covering for me after hours  [facility] Office  [phone]

## 2018-12-25 NOTE — Progress Notes (Signed)
Dr. Renaye Rakers to perform left hip procedure today, approx 1pm. Type and screen is active. Echo is being performed now. Family is aware of surgeon and plan.  Neil Crouch, MD

## 2018-12-25 NOTE — Progress Notes (Signed)
  Echocardiogram 2D Echocardiogram has been performed.  Chanelle Hodsdon L Androw 12/25/2018, 8:35 AM

## 2018-12-25 NOTE — Transfer of Care (Signed)
Immediate Anesthesia Transfer of Care Note  Patient: Caroline Ramsey  Procedure(s) Performed: LEFT HIP ARTHROPLASTY FOR FRACTURE (Left Hip)  Patient Location: PACU  Anesthesia Type:General  Level of Consciousness: awake, alert  and patient cooperative  Airway & Oxygen Therapy: Patient Spontanous Breathing  Post-op Assessment: Report given to RN and Post -op Vital signs reviewed and stable  Post vital signs: Reviewed and stable  Last Vitals:  Vitals Value Taken Time  BP 116/45 12/25/2018  4:11 PM  Temp    Pulse 79 12/25/2018  4:13 PM  Resp 26 12/25/2018  4:13 PM  SpO2 88 % 12/25/2018  4:13 PM  Vitals shown include unvalidated device data.  Last Pain:  Vitals:   12/25/18 0720  TempSrc:   PainSc: 0-No pain         Complications: No apparent anesthesia complications

## 2018-12-25 NOTE — ED Notes (Signed)
No Diet was ordered for Lunch, Pt.  Is NPO.

## 2018-12-25 NOTE — Anesthesia Preprocedure Evaluation (Addendum)
Anesthesia Evaluation  Patient identified by MRN, date of birth, ID band Patient awake    Reviewed: Allergy & Precautions, H&P , NPO status , Patient's Chart, lab work & pertinent test results, reviewed documented beta blocker date and time   Airway Mallampati: I  TM Distance: >3 FB Neck ROM: full    Dental no notable dental hx. (+) Edentulous Upper, Edentulous Lower   Pulmonary neg pulmonary ROS,    Pulmonary exam normal breath sounds clear to auscultation       Cardiovascular Exercise Tolerance: Good hypertension, Pt. on medications  Rhythm:regular Rate:Normal  Echo 1/10 1. The left ventricle has normal systolic function of 60-65%. The cavity size was normal. Left ventricular diastolic Doppler parameters are consistent with impaired relaxation. Elevated mean left atrial pressure. No evidence of left ventricular regional  wall motion abnormalities.  2. No evidence of left ventricular regional wall motion abnormalities.  3. The right ventricle has normal systolic function. The cavity size was normal. There is no increase in right ventricular wall thickness.  4. The mitral valve is normal in structure.  5. The tricuspid valve is normal in structure.  6. The aortic valve is tricuspid. Aortic valve regurgitation is moderate by color flow Doppler. The jet is centrally-directed.  7. The pulmonic valve was normal in structure. Pulmonic valve regurgitation is mild by color flow Doppler.   Neuro/Psych negative neurological ROS  negative psych ROS   GI/Hepatic negative GI ROS, Neg liver ROS,   Endo/Other  Hypothyroidism   Renal/GU negative Renal ROS  negative genitourinary   Musculoskeletal   Abdominal   Peds  Hematology negative hematology ROS (+)   Anesthesia Other Findings   Reproductive/Obstetrics negative OB ROS                            Anesthesia Physical Anesthesia Plan  ASA: III and  emergent  Anesthesia Plan: General   Post-op Pain Management:    Induction: Intravenous  PONV Risk Score and Plan: 3 and Treatment may vary due to age or medical condition  Airway Management Planned: Oral ETT and LMA  Additional Equipment:   Intra-op Plan:   Post-operative Plan: Extubation in OR  Informed Consent: I have reviewed the patients History and Physical, chart, labs and discussed the procedure including the risks, benefits and alternatives for the proposed anesthesia with the patient or authorized representative who has indicated his/her understanding and acceptance.     Dental Advisory Given  Plan Discussed with: CRNA, Anesthesiologist and Surgeon  Anesthesia Plan Comments: (  )       Anesthesia Quick Evaluation

## 2018-12-25 NOTE — ED Notes (Signed)
Report given to short stay RN. Pt transported to OR.

## 2018-12-25 NOTE — Anesthesia Procedure Notes (Signed)
Procedure Name: Intubation Date/Time: 12/25/2018 2:07 PM Performed by: Adria Dillonkin, Shaylan Tutton A, CRNA Pre-anesthesia Checklist: Patient identified, Emergency Drugs available, Patient being monitored and Suction available Patient Re-evaluated:Patient Re-evaluated prior to induction Oxygen Delivery Method: Circle system utilized Preoxygenation: Pre-oxygenation with 100% oxygen Induction Type: IV induction Ventilation: Mask ventilation without difficulty Laryngoscope Size: Miller and 2 Grade View: Grade I Tube type: Oral Tube size: 6.5 mm Number of attempts: 1 Airway Equipment and Method: Stylet Placement Confirmation: ETT inserted through vocal cords under direct vision,  positive ETCO2 and breath sounds checked- equal and bilateral Secured at: 20 cm Tube secured with: Tape Dental Injury: Teeth and Oropharynx as per pre-operative assessment

## 2018-12-26 ENCOUNTER — Encounter (HOSPITAL_COMMUNITY): Payer: Self-pay | Admitting: Orthopedic Surgery

## 2018-12-26 LAB — VITAMIN D 25 HYDROXY (VIT D DEFICIENCY, FRACTURES): Vit D, 25-Hydroxy: 25.4 ng/mL — ABNORMAL LOW (ref 30.0–100.0)

## 2018-12-26 MED ORDER — VITAMIN D 25 MCG (1000 UNIT) PO TABS
1000.0000 [IU] | ORAL_TABLET | Freq: Every day | ORAL | Status: DC
  Administered 2018-12-26 – 2018-12-28 (×3): 1000 [IU] via ORAL
  Filled 2018-12-26 (×3): qty 1

## 2018-12-26 MED ORDER — POLYETHYLENE GLYCOL 3350 17 G PO PACK
17.0000 g | PACK | Freq: Once | ORAL | Status: AC
  Administered 2018-12-26: 17 g via ORAL
  Filled 2018-12-26: qty 1

## 2018-12-26 MED ORDER — SENNOSIDES-DOCUSATE SODIUM 8.6-50 MG PO TABS
2.0000 | ORAL_TABLET | Freq: Two times a day (BID) | ORAL | Status: DC
  Administered 2018-12-26 – 2018-12-27 (×3): 2 via ORAL
  Filled 2018-12-26 (×3): qty 2

## 2018-12-26 NOTE — Progress Notes (Signed)
    Subjective: Report via family-they assist with translation.  Patient denies significant pain.  No dizziness.  Energy level somewhat low.  Not yet out of bed.  Objective:   VITALS:   Vitals:   12/25/18 1740 12/25/18 2033 12/26/18 0020 12/26/18 0350  BP: (!) 105/45 (!) 120/50 (!) 131/45 (!) 103/44  Pulse: 69 74 63 69  Resp: 20 12 14 12   Temp:  97.8 F (36.6 C) 97.8 F (36.6 C) (!) 97.5 F (36.4 C)  TempSrc:  Oral Oral Oral  SpO2: 92% 96% 95% 99%  Weight:      Height:       CBC Latest Ref Rng & Units 12/25/2018 12/24/2018 01/22/2015  WBC 4.0 - 10.5 K/uL 12.9(H) 14.6(H) 12.7(H)  Hemoglobin 12.0 - 15.0 g/dL 16.112.7 09.612.2 04.513.6  Hematocrit 36.0 - 46.0 % 38.2 37.9 40.9  Platelets 150 - 400 K/uL 292 302 172   BMP Latest Ref Rng & Units 12/25/2018 12/24/2018 01/22/2015  Glucose 70 - 99 mg/dL 409(W127(H) 119(J138(H) 478(G102(H)  BUN 8 - 23 mg/dL 13 13 14   Creatinine 0.44 - 1.00 mg/dL 9.560.90 2.130.87 0.860.85  Sodium 135 - 145 mmol/L 137 137 135  Potassium 3.5 - 5.1 mmol/L 3.8 4.0 4.0  Chloride 98 - 111 mmol/L 105 104 103  CO2 22 - 32 mmol/L 24 23 25   Calcium 8.9 - 10.3 mg/dL 9.4 57.810.0 9.8   Intake/Output      02/11 0701 - 02/12 0700 02/12 0701 - 02/13 0700   I.V. (mL/kg) 1000 (20)    IV Piggyback 100    Total Intake(mL/kg) 1100 (22)    Blood 300    Total Output 300    Net +800         Urine Occurrence 1 x       Physical Exam: General: NAD.  Supine in bed.  Answers questions and interacts appropriately with her family.  No increased work of breathing.  MSK LLE: Dorsiflexion, plantarflexion, EHL, FHL intact.  Sensation intact distally.  Feet warm.  Incision: dressing C/D/I   Assessment: 1 Day Post-Op  S/P Procedure(s) (LRB): LEFT HIP ARTHROPLASTY FOR FRACTURE (Left) by Dr. Jewel Baizeimothy D. Eulah PontMurphy on 12/25/2018  Active Problems:   Closed left hip fracture (HCC)   Cardiomegaly   Hypothyroidism   Essential hypertension   Closed left femoral neck fracture, status post hemiarthroplasty Pain  controlled Not yet mobilized with therapy  Plan: Up with therapy Incentive Spirometry Apply ice as needed  Weightbearing: WBAT LLE Insicional and dressing care: Dressings left intact until follow-up Showering: Keep dressing dry VTE prophylaxis: Lovenox 40mg  qd, SCDs, ambulation Pain control: Minimize narcotics.  Norco for breakthrough pain. Follow - up plan: 2 weeks Contact information:  Margarita Ranaimothy Murphy MD, Aquilla HackerHenry Martensen PA-C  Dispo: TBD.  Therapy evaluations pending.  Plan for discharge when ready medically and mobilized.   Lucretia KernHenry Calvin Martensen III, PA-C 12/26/2018, 8:08 AM

## 2018-12-26 NOTE — Progress Notes (Signed)
PROGRESS NOTE    Caroline Ramsey  UJW:[id number] DOB: [birth year] DOA: 12/24/2018 [facility]CP: [facility]   Brief Narrative:   Caroline Ramsey is a 85 y.o. female with medical history significant of htn, blind in left eye Presented with   Fall Jan 29th she missed the last step and fell down was caught by her [place] but still fell and landed on her left hip did not hit her head no LOC. Plain imaging neg initially but continued to have hip pain presented today.   Work-up significant for left femoral neck fracture, status post post surgical repair by Dr. [name] 12/25/2018.   Subjective:   Caroline Ramsey seen and examined today.  Members at bedside, they help with translation, he had a good night sleep, her hip pain is controlled, they report no bowel movement since admission.  Assessment & Plan   Left femoral neck fracture  -Status post fall, on December 12, 2018 -CT scan showed left femoral neck fracture with varus angulation  -Orthopedics consulted and appreciated  -Echocardiogram as below  -Given patient's age, comorbidities, she is at least moderate risk for surgery  -Continue pain control with IV morphine as well as hydrocodone, Robaxin -Status post closed left hip fracture Dr. [name] 12/25/2018 -Waiting PT/OT evaluation, likely will need placement -Vitamin D level came back low at 25, will start vitamin D supplements  Cardiomegaly/heart murmur -Echocardiogram obtained: LV function 60-65%, LV diastolic dysfunction parameters consistent with impaired relaxation.  Elevated mean left atrial pressure.  No evidence of LV regional wall motion abnormalities.  Moderate aortic valve regurgitation. -Denies any dyspnea or chest pain today -We will likely need to follow-up with cardiology as an outpatient  Hypothyroidism -Continue Synthroid  Hyperlipidemia -Continue with home dose statin  Essential Hypertension -Continue metoprolol  Left eye  blindness -Chronic  DVT Prophylaxis  SCDs, subcu Lovenox  Code Status: Full  Family Communication: Family at bedside  Disposition Plan: Admitted.  Pending surgery today.  Would likely need SNF, however to be determined.  Consultants Orthopedics  Procedures  Echocardiogram  Antibiotics   Anti-infectives (From admission, onward)   Start     Dose/Rate Route Frequency Ordered Stop   12/26/18 0230  ceFAZolin (ANCEF) IVPB 1 g/50 mL premix     1 g 100 mL/hr over 30 Minutes Intravenous  Once 12/25/18 1747 12/26/18 0213   12/25/18 0600  ceFAZolin (ANCEF) IVPB 2g/100 mL premix     2 g 200 mL/hr over 30 Minutes Intravenous On call to O.R. 12/25/18 0247 12/25/18 1441       Objective:   Vitals:   12/25/18 2033 12/26/18 0020 12/26/18 0350 12/26/18 0944  BP: (!) 120/50 (!) 131/45 (!) 103/44 (!) 110/39  Pulse: 74 63 69 100  Resp: 12 14 12 16   Temp: 97.8 F (36.6 C) 97.8 F (36.6 C) (!) 97.5 F (36.4 C) 98.3 F (36.8 C)  TempSrc: Oral Oral Oral Oral  SpO2: 96% 95% 99% 93%  Weight:      Height:        Intake/Output Summary (Last 24 hours) at 12/26/2018 1220 Last data filed at 12/26/2018 0900 Gross per 24 hour  Intake 1220 ml  Output 300 ml  Net 920 ml   Filed Weights   12/24/18 1444  Weight: 49.9 kg    Exam   Awake Alert, pleasant, thin appearing, frail, sitting in bed in no apparent distress Symmetrical Chest wall movement, Good air movement bilaterally, CTAB RRR,No Gallops, difficult for heart murmur, no Parasternal  Heave +ve B.Sounds, Abd Soft, No tenderness, No rebound - guarding or rigidity. No Cyanosis, Clubbing or edema, left lower extremity with knee immobilizer, site of surgery in left upper hip with no blood or discharge can be seen through her gauze    Data Reviewed: I have personally reviewed following labs and imaging studies  CBC: Recent Labs  Lab 12/24/18 2001 12/25/18 0312  WBC 14.6* 12.9*  NEUTROABS 12.3*  --   HGB 12.2 12.7  HCT 37.9 38.2   MCV 90.7 91.4  PLT 302 292   Basic Metabolic Panel: Recent Labs  Lab 12/24/18 2001 12/25/18 0312  NA 137 137  K 4.0 3.8  CL 104 105  CO2 23 24  GLUCOSE 138* 127*  BUN 13 13  CREATININE 0.87 0.90  CALCIUM 10.0 9.4   GFR: Estimated Creatinine Clearance: 28.8 mL/min (by C-G formula based on SCr of 0.9 mg/dL). Liver Function Tests: Recent Labs  Lab 12/25/18 0312  AST 21  ALT 13  ALKPHOS 78  BILITOT 0.8  PROT 7.1  ALBUMIN 2.9*   No results for input(s): LIPASE, AMYLASE in the last 168 hours. No results for input(s): AMMONIA in the last 168 hours. Coagulation Profile: Recent Labs  Lab 12/24/18 2001  INR 1.02   Cardiac Enzymes: No results for input(s): CKTOTAL, CKMB, CKMBINDEX, TROPONINI in the last 168 hours. BNP (last 3 results) No results for input(s): PROBNP in the last 8760 hours. HbA1C: No results for input(s): HGBA1C in the last 72 hours. CBG: No results for input(s): GLUCAP in the last 168 hours. Lipid Profile: No results for input(s): CHOL, HDL, LDLCALC, TRIG, CHOLHDL, LDLDIRECT in the last 72 hours. Thyroid Function Tests: No results for input(s): TSH, T4TOTAL, FREET4, T3FREE, THYROIDAB in the last 72 hours. Anemia Panel: No results for input(s): VITAMINB12, FOLATE, FERRITIN, TIBC, IRON, RETICCTPCT in the last 72 hours. Urine analysis:    Component Value Date/Time   COLORURINE YELLOW 12/25/2018 0152   APPEARANCEUR CLEAR 12/25/2018 0152   LABSPEC 1.023 12/25/2018 0152   PHURINE 6.0 12/25/2018 0152   GLUCOSEU NEGATIVE 12/25/2018 0152   HGBUR SMALL (A) 12/25/2018 0152   BILIRUBINUR NEGATIVE 12/25/2018 0152   KETONESUR 5 (A) 12/25/2018 0152   PROTEINUR NEGATIVE 12/25/2018 0152   UROBILINOGEN 0.2 01/22/2015 1130   NITRITE NEGATIVE 12/25/2018 0152   LEUKOCYTESUR NEGATIVE 12/25/2018 0152   Sepsis Labs: @LABRCNTIP (procalcitonin:4,lacticidven:4)  )No results found for this or any previous visit (from the past 240 hour(s)).    Radiology  Studies: Dg Chest 1 View  Result Date: 12/24/2018 CLINICAL DATA:  Fall, preop EXAM: CHEST  1 VIEW COMPARISON:  01/22/2015 FINDINGS: There is hyperinflation of the lungs compatible with COPD. Cardiomegaly with vascular congestion. No overt edema. Suspect small effusions. No confluent opacities or acute bony abnormality. IMPRESSION: COPD. Cardiomegaly, vascular congestion.  Suspect small effusions. Electronically Signed   By: Charlett NoseKevin  Dover M.D.   On: 12/24/2018 19:59   Ct Hip Left Wo Contrast  Result Date: 12/24/2018 CLINICAL DATA:  Hip trauma.  Fracture. EXAM: CT OF THE LEFT HIP WITHOUT CONTRAST TECHNIQUE: Multidetector CT imaging of the left hip was performed according to the standard protocol. Multiplanar CT image reconstructions were also generated. COMPARISON:  Plain films 12/24/2018 FINDINGS: There is a left femoral neck fracture, basicervical with varus angulation. No subluxation or dislocation within the left hip. Mild degenerative changes within the left hip. No soft tissue abnormality within the visualized intra-abdominal space. IMPRESSION: Left femoral neck fracture with varus angulation as seen on prior  plain films. Electronically Signed   By: Charlett NoseKevin  Dover M.D.   On: 12/24/2018 22:59   Dg Hip Unilat With Pelvis 2-3 Views Left  Result Date: 12/24/2018 CLINICAL DATA:  Fall with persistent pain since fall. EXAM: DG HIP (WITH OR WITHOUT PELVIS) 2-3V LEFT COMPARISON:  None. FINDINGS: Significantly displaced fracture of the LEFT femoral neck, intertrochanteric, with nearly 90 degree angulation deformity at the fracture site. Femoral head remains appropriately positioned relative to the acetabulum. No additional fracture seen within the osseous pelvis or about the RIGHT hip. Soft tissues about the pelvis and LEFT hip are unremarkable. IMPRESSION: Significantly displaced fracture of the LEFT femoral neck, intertrochanteric, with nearly 90 degree angulation deformity at the fracture site. Electronically  Signed   By: Bary RichardStan  Maynard M.D.   On: 12/24/2018 18:40   Dg Femur Port Min 2 Views Left  Result Date: 12/25/2018 CLINICAL DATA:  Hip replacement EXAM: LEFT FEMUR PORTABLE 2 VIEWS COMPARISON:  None. FINDINGS: Changes of left hip replacement. No hardware complicating feature. No acute bony abnormality. IMPRESSION: Left hip replacement.  No visible complicating feature. Electronically Signed   By: Charlett NoseKevin  Dover M.D.   On: 12/25/2018 19:44     Scheduled Meds: . acetaminophen  500 mg Oral Q6H  . cholecalciferol  1,000 Units Oral Daily  . enoxaparin (LOVENOX) injection  40 mg Subcutaneous Q24H  . levothyroxine  75 mcg Oral QAC breakfast  . metoprolol succinate  25 mg Oral Daily  . polyethylene glycol  17 g Oral Once  . senna-docusate  2 tablet Oral BID  . simvastatin  10 mg Oral Daily   Continuous Infusions: . methocarbamol (ROBAXIN) IV       LOS: 2 days    Huey Bienenstockawood Elgergawy MD. on 12/26/2018 at 12:20 PM  Between 7am to 7pm - Please see pager noted on [email or web address]  After 7pm go to [email or web address]  And look for the night coverage person covering for me after hours  [facility] Office  [phone]

## 2018-12-26 NOTE — Progress Notes (Signed)
Orthopedic Tech Progress Note Patient Details:  Caroline Ramsey 1927/09/06 [id number]  Ortho Devices Type of Ortho Device: Knee Immobilizer       Saul Fordyce 12/26/2018, 10:30 AM

## 2018-12-26 NOTE — Evaluation (Signed)
Occupational Therapy Evaluation Patient Details Name: Caroline Ramsey MRN: [id number] DOB: 1927/04/07 Today's Date: 12/26/2018    History of Present Illness Caroline Ramsey is a 85 y.o. female with medical history significant of HTN, blind in left eye; Larey Seat 1/29, plain imaging neg at that time, but pain persisted and she came to ED 2/11; imaging showed L fem neck fracture; Now s/p L hip arthroplasty, WBAT   Clinical Impression   Pt with decline in function and safety with ADLs and ADl mobility with decreased strength, balance ane endurance. PTA, pt lived at home with family and was independent with ADLs and used no AD for mobility. Pt will have 24/7 sup and assist at home. Pt would benefit from acute OT services to address impairments to maximize level of function and safety    Follow Up Recommendations  Home health OT;Supervision/Assistance - 24 hour    Equipment Recommendations  3 in 1 bedside commode;Other (comment)(reacher)    Recommendations for Other Services       Precautions / Restrictions Precautions Precautions: Posterior Hip Precaution Booklet Issued: Yes (comment) Restrictions Weight Bearing Restrictions: Yes LLE Weight Bearing: Weight bearing as tolerated      Mobility Bed Mobility Overal bed mobility: Needs Assistance Bed Mobility: Supine to Sit     Supine to sit: Mod assist;+2 for physical assistance     General bed mobility comments: assist with L LE to EOB and to elevate trunk  Transfers Overall transfer level: Needs assistance Equipment used: Rolling walker (2 wheeled) Transfers: Sit to/from Stand Sit to Stand: Min assist;+2 physical assistance;+2 safety/equipment;Mod assist         General transfer comment: mod A + 2 fomr EOB to RW, min A +2 from [facility] to RW    Balance Overall balance assessment: Needs assistance Sitting-balance support: Bilateral upper extremity supported;Feet supported Sitting balance-Leahy Scale: Fair     Standing balance  support: Bilateral upper extremity supported;During functional activity Standing balance-Leahy Scale: Poor                             ADL either performed or assessed with clinical judgement   ADL Overall ADL's : Needs assistance/impaired     Grooming: Wash/dry hands;Wash/dry face;Sitting;Min guard;With caregiver independent assisting   Upper Body Bathing: Min guard;Sitting;With caregiver independent assisting   Lower Body Bathing: Maximal assistance;With caregiver independent assisting   Upper Body Dressing : Min guard;Sitting;With caregiver independent assisting   Lower Body Dressing: Maximal assistance;With caregiver independent assisting   Toilet Transfer: Moderate assistance;Minimal assistance;+2 for physical assistance;Stand-pivot;BSC   Toileting- Clothing Manipulation and Hygiene: Moderate assistance;Sitting/lateral lean;Sit to/from stand       Functional mobility during ADLs: Moderate assistance;Minimal assistance;+2 for safety/equipment;Rolling walker       Vision Baseline Vision/History: No visual deficits Patient Visual Report: No change from baseline       Perception     Praxis      Pertinent Vitals/Pain Pain Assessment: No/denies pain     Hand Dominance Right   Extremity/Trunk Assessment Upper Extremity Assessment Upper Extremity Assessment: Generalized weakness   Lower Extremity Assessment Lower Extremity Assessment: Defer to PT evaluation   Cervical / Trunk Assessment Cervical / Trunk Assessment: Normal   Communication Communication Communication: No difficulties   Cognition Arousal/Alertness: Awake/alert Behavior During Therapy: WFL for tasks assessed/performed Overall Cognitive Status: Within Functional Limits for tasks assessed  General Comments       Exercises     Shoulder Instructions      Home Living Family/patient expects to be discharged to:: Private  residence Living Arrangements: Children;Other relatives Available Help at Discharge: Family;Friend(s);Available 24 hours/day Type of Home: House Home Access: Stairs to enter [employer] of Steps: 4-5 Entrance Stairs-Rails: Right Home Layout: One level     Bathroom Shower/Tub: Producer, television/film/video: Standard     Home Equipment: Shower seat          Prior Functioning/Environment Level of Independence: Independent                 OT Problem List: Decreased strength;Decreased activity tolerance;Decreased knowledge of use of DME or AE;Impaired balance (sitting and/or standing);Decreased coordination      OT Treatment/Interventions: Self-care/ADL training;DME and/or AE instruction;Therapeutic activities;Patient/family education;Therapeutic exercise    OT Goals(Current goals can be found in the care plan section) Acute Rehab OT Goals Patient Stated Goal: go home OT Goal Formulation: With patient/family Time For Goal Achievement: 01/09/19 ADL Goals Pt Will Perform Grooming: with supervision;with set-up;sitting;with caregiver independent in assisting Pt Will Perform Upper Body Bathing: with supervision;with set-up;sitting;with caregiver independent in assisting Pt Will Perform Lower Body Bathing: with mod assist;with caregiver independent in assisting Pt Will Perform Upper Body Dressing: with supervision;with set-up;sitting Pt Will Perform Lower Body Dressing: with mod assist;with caregiver independent in assisting Pt Will Transfer to Toilet: with min assist;ambulating;grab bars Pt Will Perform Toileting - Clothing Manipulation and hygiene: with min assist;with caregiver independent in assisting Pt Will Perform Tub/Shower Transfer: with min assist;ambulating;shower seat;rolling walker;with caregiver independent in assisting  OT Frequency: Min 2X/week   Barriers to D/C:    no barriers       Co-evaluation PT/OT/SLP Co-Evaluation/Treatment:  Yes Reason for Co-Treatment: For patient/therapist safety;To address functional/ADL transfers   OT goals addressed during session: ADL's and self-care;Proper use of Adaptive equipment and DME      AM-PAC OT "6 Clicks" Daily Activity     Outcome Measure Help from another person eating meals?: None Help from another person taking care of personal grooming?: A Little Help from another person toileting, which includes using toliet, bedpan, or urinal?: A Lot Help from another person bathing (including washing, rinsing, drying)?: A Lot Help from another person to put on and taking off regular upper body clothing?: A Little Help from another person to put on and taking off regular lower body clothing?: A Lot 6 Click Score: 16   End of Session Equipment Utilized During Treatment: Gait belt;Rolling walker;Other (comment)(BSC)  Activity Tolerance: Patient tolerated treatment well Patient left: in chair;with call bell/phone within reach;with family/visitor present  OT Visit Diagnosis: Unsteadiness on feet (R26.81);Other abnormalities of gait and mobility (R26.89);Muscle weakness (generalized) (M62.81);History of falling (Z91.81) Pain - Right/Left: Left Pain - part of body: Leg;Knee                Time: 1324-4010 OT Time Calculation (min): 34 min Charges:  OT General Charges $OT Visit: 1 Visit OT Evaluation $OT Eval Moderate Complexity: 1 Mod    Galen Manila 12/26/2018, 2:04 PM

## 2018-12-26 NOTE — Evaluation (Signed)
Physical Therapy Evaluation Patient Details Name: Nari Broeker MRN: [id number] DOB: [birth year] Today's Date: 12/26/2018   History of Present Illness  Zuleyka Manner is a 85 y.o. female with medical history significant of HTN, blind in left eye; Larey Seat 1/29, plain imaging neg at that time, but pain persisted and she came to ED 2/11; imaging showed L fem neck fracture; Now s/p L hip arthroplasty, WBAT, Posterior Prec  Clinical Impression   Patient is s/p above surgery resulting in functional limitations due to the deficits listed below (see PT Problem List). Walking independently and managing own ADLs prior to admission; Presents with decr functional mobility, decr activity tolerance, decr knowledge of precautions; I favor dc home for return to familiar caregivers, routine, and environment;  Patient will benefit from skilled PT to increase their independence and safety with mobility to allow discharge to the venue listed below.       Follow Up Recommendations Home health PT;Supervision/Assistance - 24 hour;Other (comment)(as well as HHOT and Aide)    Equipment Recommendations  Rolling walker with 5" wheels;Other (comment)(Youth-sized)    Recommendations for Other Services       Precautions / Restrictions Precautions Precautions: Posterior Hip Precaution Booklet Issued: Yes (comment) Restrictions Weight Bearing Restrictions: Yes LLE Weight Bearing: Weight bearing as tolerated      Mobility  Bed Mobility Overal bed mobility: Needs Assistance Bed Mobility: Supine to Sit     Supine to sit: Mod assist;+2 for physical assistance     General bed mobility comments: assist with L LE to EOB and to elevate trunk  Transfers Overall transfer level: Needs assistance Equipment used: Rolling walker (2 wheeled) Transfers: Sit to/from Stand Sit to Stand: Min assist;+2 physical assistance;+2 safety/equipment;Mod assist         General transfer comment: mod A + 2 fomr EOB to RW, min A  +2 from [facility] to RW  Ambulation/Gait Ambulation/Gait assistance: Min assist Gait Distance (Feet): 15 Feet Assistive device: Rolling walker (2 wheeled)(youth-sized ) Gait Pattern/deviations: Step-to pattern;Decreased step length - right;Decreased step length - left;Trunk flexed     General Gait Details: short steps, but no overt loss of balance; reports no pain  Stairs            Wheelchair Mobility    Modified Rankin (Stroke Patients Only)       Balance Overall balance assessment: Needs assistance Sitting-balance support: Bilateral upper extremity supported;Feet supported Sitting balance-Leahy Scale: Fair     Standing balance support: Bilateral upper extremity supported;During functional activity Standing balance-Leahy Scale: Poor                               Pertinent Vitals/Pain Pain Assessment: No/denies pain    Home Living Family/patient expects to be discharged to:: Private residence Living Arrangements: Children;Other relatives Available Help at Discharge: Family;Friend(s);Available 24 hours/day Type of Home: House Home Access: Stairs to enter Entrance Stairs-Rails: Right Entrance Stairs-Number of Steps: 4-5 Home Layout: One level Home Equipment: Shower seat      Prior Function Level of Independence: Independent               Hand Dominance   Dominant Hand: Right    Extremity/Trunk Assessment   Upper Extremity Assessment Upper Extremity Assessment: Defer to OT evaluation    Lower Extremity Assessment Lower Extremity Assessment: RLE deficits/detail RLE Deficits / Details: Grossly decr AROM and strength postop    Cervical / Trunk Assessment Cervical / Trunk Assessment:  Normal  Communication   Communication: No difficulties;Prefers language other than English(Gujarati)  Cognition Arousal/Alertness: Awake/alert Behavior During Therapy: WFL for tasks assessed/performed Overall Cognitive Status: Within Functional Limits for  tasks assessed                                        General Comments General comments (skin integrity, edema, etc.): Pt's family member assisted with interpreting, including calling the family member Ms. Veltri lives with on speaker for clarification    Exercises     Assessment/Plan    PT Assessment Patient needs continued PT services  PT Problem List Decreased strength;Decreased range of motion;Decreased activity tolerance;Decreased balance;Decreased mobility;Decreased coordination;Decreased knowledge of use of DME;Decreased knowledge of precautions       PT Treatment Interventions DME instruction;Gait training;Stair training;Functional mobility training;Therapeutic activities;Therapeutic exercise;Balance training;Patient/family education    PT Goals (Current goals can be found in the Care Plan section)  Acute Rehab PT Goals Patient Stated Goal: go home PT Goal Formulation: With patient Time For Goal Achievement: 01/09/19 Potential to Achieve Goals: Good    Frequency Min 6X/week   Barriers to discharge Inaccessible home environment 4-5 steps to enter; lots of family can help her up    Co-evaluation PT/OT/SLP Co-Evaluation/Treatment: Yes Reason for Co-Treatment: For patient/therapist safety PT goals addressed during session: Mobility/safety with mobility OT goals addressed during session: ADL's and self-care;Proper use of Adaptive equipment and DME       AM-PAC PT "6 Clicks" Mobility  Outcome Measure Help needed turning from your back to your side while in a flat bed without using bedrails?: A Lot Help needed moving from lying on your back to sitting on the side of a flat bed without using bedrails?: A Lot Help needed moving to and from a bed to a chair (including a wheelchair)?: A Lot Help needed standing up from a chair using your arms (e.g., wheelchair or bedside chair)?: A Little Help needed to walk in hospital room?: A Little Help needed climbing  3-5 steps with a railing? : A Little 6 Click Score: 15    End of Session Equipment Utilized During Treatment: Gait belt Activity Tolerance: Patient tolerated treatment well Patient left: in chair;with call bell/phone within reach;with family/visitor present Nurse Communication: Mobility status PT Visit Diagnosis: Other abnormalities of gait and mobility (R26.89);Difficulty in walking, not elsewhere classified (R26.2)    Time: 1610-9604 PT Time Calculation (min) (ACUTE ONLY): 34 min   Charges:   PT Evaluation $PT Eval Moderate Complexity: 1 Mod          Van Clines, [place]  Acute Rehabilitation Services Pager [phone] Office [phone]   Levi Aland 12/26/2018, 4:34 PM

## 2018-12-26 NOTE — Op Note (Signed)
12/25/2018  5:50 PM  PATIENT:  Caroline Ramsey   MRN: [id number]  PRE-OPERATIVE DIAGNOSIS:  Left Hip Fracture  POST-OPERATIVE DIAGNOSIS:  Left Hip Fracture  PROCEDURE:  Procedure(s): LEFT HIP ARTHROPLASTY FOR FRACTURE  PREOPERATIVE INDICATIONS:  Caroline Ramsey is an 85 y.o. female who was admitted 12/24/2018 with a diagnosis of <principal problem not specified> and elected for surgical management.  The risks benefits and alternatives were discussed with the patient including but not limited to the risks of nonoperative treatment, versus surgical intervention including infection, bleeding, nerve injury, periprosthetic fracture, the need for revision surgery, dislocation, leg length discrepancy, blood clots, cardiopulmonary complications, morbidity, mortality, among others, and they were willing to proceed.  Predicted outcome is good, although there will be at least a six to nine month expected recovery.   OPERATIVE REPORT     SURGEON:  Edmonia Lynch, MD    ASSISTANT:  Roxan Hockey, PA-C, he was present and scrubbed throughout the case, critical for completion in a timely fashion, and for retraction, instrumentation, and closure.     ANESTHESIA:  General    COMPLICATIONS:  None.      COMPONENTS:  [employer] Acolade: restoration HA stem 7x13 with a +5 42 head ball   PROCEDURE IN DETAIL: The patient was met in the holding area and identified.  The appropriate hip  was marked at the operative site. The patient was then transported to the OR and  placed under general anesthesia.  At that point, the patient was  placed in the lateral decubitus position with the operative side up and  secured to the operating room table and all bony prominences padded.     The operative lower extremity was prepped from the iliac crest to the toes.  Sterile draping was performed.  Time out was performed prior to incision.      A routine posterolateral approach was utilized via sharp dissection  carried down to  the subcutaneous tissue.  Gross bleeders were Bovie  coagulated.  The iliotibial band was identified and incised  along the length of the skin incision.  Self-retaining retractors were  inserted. I examined the bursa there was significant hematoma and edema I performed a bursectomy here.  With the hip internally rotated, the short external rotators  were identified. The piriformis was tagged with FiberWire, and the hip capsule released in a T-type fashion.  The femoral neck was exposed, and I resected the femoral neck using the appropriate jig. This was performed at approximately a thumb's breadth above the lesser trochanter.    I then exposed the deep acetabulum, cleared out any tissue including the ligamentum teres, and included the hip capsule in the FiberWire used above and below the T.    I then prepared the proximal femur using the cookie-cutter, the lateralizing reamer, and then sequentially broached.  A trial utilized, and I reduced the hip and it was found to have excellent stability with functional range of motion. The trial components were then removed.   The canal and acetabulum were thoroughly irrigated  I inserted the pressfit stem and placed the head and neck collar. The hip was reduced with appropriate force and was stable through a range of motion.   I then used a 2 mm drill bits to pass the FiberWire suture from the capsule and puriform is through the greater trochanter, and secured this. Excellent posterior capsular repair was achieved. I also closed the T in the capsule.  I then irrigated the hip copiously again with  pulse lavage, and repaired the fascia with Vicryl, followed by Vicryl for the subcutaneous tissue, Monocryl for the skin, Steri-Strips and sterile gauze. The wounds were injected. The patient was then awakened and returned to PACU in stable and satisfactory condition. There were no complications.  POST-OP PLAN: Weight bearing as tolerated. DVT px will consist of  SCD's, mobilization and chemical px  Edmonia Lynch, MD Orthopedic Surgeon [phone]   12/26/2018 5:50 PM

## 2018-12-27 ENCOUNTER — Other Ambulatory Visit: Payer: Self-pay

## 2018-12-27 LAB — BASIC METABOLIC PANEL
Anion gap: 10 (ref 5–15)
BUN: 26 mg/dL — ABNORMAL HIGH (ref 8–23)
CO2: 23 mmol/L (ref 22–32)
CREATININE: 0.95 mg/dL (ref 0.44–1.00)
Calcium: 8.5 mg/dL — ABNORMAL LOW (ref 8.9–10.3)
Chloride: 103 mmol/L (ref 98–111)
GFR calc Af Amer: >60 mL/min (ref >60)
GFR calc non Af Amer: 53 mL/min — ABNORMAL LOW (ref >60)
Glucose, Bld: 112 mg/dL — ABNORMAL HIGH (ref 70–99)
Potassium: 3.6 mmol/L (ref 3.5–5.1)
Sodium: 136 mmol/L (ref 135–145)

## 2018-12-27 LAB — CBC
HCT: 28.8 % — ABNORMAL LOW (ref 36.0–46.0)
Hemoglobin: 9.4 g/dL — ABNORMAL LOW (ref 12.0–15.0)
MCH: 29.9 pg (ref 26.0–34.0)
MCHC: 32.6 g/dL (ref 30.0–36.0)
MCV: 91.7 fL (ref 80.0–100.0)
Platelets: 265 10*3/uL (ref 150–400)
RBC: 3.14 MIL/uL — ABNORMAL LOW (ref 3.87–5.11)
RDW: 13.3 % (ref 11.5–15.5)
WBC: 12.5 10*3/uL — ABNORMAL HIGH (ref 4.0–10.5)
nRBC: 0 % (ref 0.0–0.2)

## 2018-12-27 MED ORDER — FERROUS SULFATE 325 (65 FE) MG PO TABS
325.0000 mg | ORAL_TABLET | Freq: Two times a day (BID) | ORAL | Status: DC
  Administered 2018-12-27 – 2018-12-28 (×2): 325 mg via ORAL
  Filled 2018-12-27 (×2): qty 1

## 2018-12-27 MED ORDER — POLYETHYLENE GLYCOL 3350 17 G PO PACK
17.0000 g | PACK | Freq: Every day | ORAL | Status: DC
  Administered 2018-12-27: 17 g via ORAL
  Filled 2018-12-27: qty 1

## 2018-12-27 MED ORDER — ENOXAPARIN SODIUM 30 MG/0.3ML ~~LOC~~ SOLN
30.0000 mg | SUBCUTANEOUS | Status: DC
  Administered 2018-12-27 – 2018-12-28 (×2): 30 mg via SUBCUTANEOUS
  Filled 2018-12-27 (×2): qty 0.3

## 2018-12-27 MED ORDER — SENNOSIDES-DOCUSATE SODIUM 8.6-50 MG PO TABS
3.0000 | ORAL_TABLET | Freq: Two times a day (BID) | ORAL | Status: DC
  Filled 2018-12-27: qty 3

## 2018-12-27 MED ORDER — MAGNESIUM CITRATE PO SOLN
0.5000 | Freq: Once | ORAL | Status: AC
  Administered 2018-12-27: 0.5 via ORAL
  Filled 2018-12-27: qty 296

## 2018-12-27 NOTE — Plan of Care (Signed)
  Problem: Education: Goal: Knowledge of General Education information will improve Description Including pain rating scale, medication(s)/side effects and non-pharmacologic comfort measures Outcome: Progressing   Problem: Clinical Measurements: Goal: Respiratory complications will improve Outcome: Not Applicable Goal: Cardiovascular complication will be avoided Outcome: Progressing   Problem: Activity: Goal: Risk for activity intolerance will decrease Outcome: Progressing   Problem: Elimination: Goal: Will not experience complications related to bowel motility Outcome: Progressing   Problem: Pain Managment: Goal: General experience of comfort will improve Outcome: Progressing   

## 2018-12-27 NOTE — Care Management Note (Signed)
Case Management Note  Patient Details  Name: Caroline Ramsey MRN: [id number] Date of Birth: February 19, 1927  Subjective/Objective:  85 yr old female s/p fall at home, resulting in a left femur fracture.Patient underwent a left total hip arthroplasty.                   Action/Plan:  Case manager spoke with patient and her family concerning discharge planning. Patient is non english speaking, actually only engages in conversation with her family. Choice for Home Health Agency offered, referral called to Rhunette Croft, Liaison for [facility] ([facility]). Patient will be going to [address] [place], [place] [address], call [facility] [phone] to setup appointment times.     Expected Discharge Date:    12/27/18              Expected Discharge Plan:  Home w Home Health Services  In-House Referral:  NA  Discharge planning Services  CM Consult  Post Acute Care Choice:  Durable Medical Equipment, Home Health Choice offered to:  Adult Children  DME Arranged:  [facility] DME Agency:  [facility]  HH Arranged:  PT, OT, Nurse's Aide, RN HH Agency:  Other - See comment([facility])  Status of Service:  Completed, signed off  If discussed at [employer], dates discussed:    Additional Comments:  Durenda Guthrie, RN 12/27/2018, 11:19 AM

## 2018-12-27 NOTE — Progress Notes (Signed)
Pt has oral temp of 100.2 MD notified will give tylenol and monitor

## 2018-12-27 NOTE — Progress Notes (Signed)
RN gave pt Hydrocodone for pain and her fever, Incentive spirometer encouraged and family educated. Witnessed pt use the device 5x. Highest she got was 1250

## 2018-12-27 NOTE — Progress Notes (Signed)
Physical Therapy Treatment Patient Details Name: Caroline Ramsey MRN: [id number] DOB: 04/10/1927 Today's Date: 12/27/2018    History of Present Illness Caroline Ramsey is a 85 y.o. female with medical history significant of HTN, blind in left eye; Larey Seat 1/29, plain imaging neg at that time, but pain persisted and she came to ED 2/11; imaging showed L fem neck fracture; Now s/p L hip arthroplasty, WBAT    PT Comments    Patient seen for mobility progression. Pt requires min/mod A for functional transfers and gait training this session. Pt requires increased assistance when stepping backwards due to LOB. Family present and interpreted. Continue to progress as tolerated.    Follow Up Recommendations  Home health PT;Supervision/Assistance - 24 hour;Other (comment)(as well as HHOT and Aide)     Equipment Recommendations  Rolling walker with 5" wheels;Other (comment)(Youth-sized)    Recommendations for Other Services       Precautions / Restrictions Precautions Precautions: Posterior Hip Precaution Comments: precautions reviewed with pt and pt's family  Restrictions Weight Bearing Restrictions: Yes LLE Weight Bearing: Weight bearing as tolerated    Mobility  Bed Mobility Overal bed mobility: Needs Assistance Bed Mobility: Supine to Sit     Supine to sit: Mod assist     General bed mobility comments: pt OOB in chair upon arrival  Transfers Overall transfer level: Needs assistance Equipment used: Rolling walker (2 wheeled) Transfers: Sit to/from Stand Sit to Stand: Min assist         General transfer comment: assist to power up and to gain balance upon standing; cues for safe hand placement  Ambulation/Gait Ambulation/Gait assistance: Mod assist Gait Distance (Feet): 20 Feet Assistive device: Rolling walker (2 wheeled)(youth-sized ) Gait Pattern/deviations: Step-to pattern;Decreased step length - right;Decreased step length - left;Trunk flexed Gait velocity: decreased    General Gait Details: assistance required to guide RW and maintain safe proximity as pt tends to get too close to RW which increases posterior bias; pt needs increased assistance when attempting to step backwards due to LOB   Stairs             Wheelchair Mobility    Modified Rankin (Stroke Patients Only)       Balance Overall balance assessment: Needs assistance Sitting-balance support: Bilateral upper extremity supported;Feet supported Sitting balance-Leahy Scale: Fair     Standing balance support: Bilateral upper extremity supported;During functional activity Standing balance-Leahy Scale: Poor                              Cognition Arousal/Alertness: Awake/alert Behavior During Therapy: WFL for tasks assessed/performed Overall Cognitive Status: Difficult to assess                                        Exercises      General Comments General comments (skin integrity, edema, etc.): family present and interpreted       Pertinent Vitals/Pain Pain Assessment: No/denies pain Faces Pain Scale: Hurts a little bit Pain Location: L foot with mobility Pain Descriptors / Indicators: Grimacing;Guarding;Sore Pain Intervention(s): Limited activity within patient's tolerance;Monitored during session;Repositioned    Home Living                      Prior Function            PT Goals (current goals can now be  found in the care plan section) Progress towards PT goals: Progressing toward goals    Frequency    Min 6X/week      PT Plan Current plan remains appropriate    Co-evaluation              AM-PAC PT "6 Clicks" Mobility   Outcome Measure  Help needed turning from your back to your side while in a flat bed without using bedrails?: A Lot Help needed moving from lying on your back to sitting on the side of a flat bed without using bedrails?: A Lot Help needed moving to and from a bed to a chair (including a  wheelchair)?: A Lot Help needed standing up from a chair using your arms (e.g., wheelchair or bedside chair)?: A Little Help needed to walk in hospital room?: A Little Help needed climbing 3-5 steps with a railing? : A Little 6 Click Score: 15    End of Session Equipment Utilized During Treatment: Gait belt Activity Tolerance: Patient tolerated treatment well Patient left: in chair;with call bell/phone within reach;with family/visitor present Nurse Communication: Mobility status PT Visit Diagnosis: Other abnormalities of gait and mobility (R26.89);Difficulty in walking, not elsewhere classified (R26.2)     Time: 7939-0300 PT Time Calculation (min) (ACUTE ONLY): 26 min  Charges:  $Gait Training: 23-37 mins                     Caroline Ramsey, PTA Acute Rehabilitation Services Pager: [phone] Office: [phone]     Caroline Ramsey 12/27/2018, 2:33 PM

## 2018-12-27 NOTE — Progress Notes (Signed)
Occupational Therapy Treatment Patient Details Name: Caroline Ramsey MRN: [id number] DOB: [birth year] Today's Date: 12/27/2018    History of present illness Caroline Ramsey is a 85 y.o. female with medical history significant of HTN, blind in left eye; Larey Seat 1/29, plain imaging neg at that time, but pain persisted and she came to ED 2/11; imaging showed L fem neck fracture; Now s/p L hip arthroplasty, WBAT   OT comments  Pt making good progress with functional goals. Family members present for caregiver trg for ADL mobility with RW  and LB ADL safety. OT will continue to follow acutely  Follow Up Recommendations  Home health OT;Supervision/Assistance - 24 hour    Equipment Recommendations  3 in 1 bedside commode;Other (comment)(reacher)    Recommendations for Other Services      Precautions / Restrictions Precautions Precautions: Posterior Hip Restrictions Weight Bearing Restrictions: Yes LLE Weight Bearing: Weight bearing as tolerated       Mobility Bed Mobility Overal bed mobility: Needs Assistance Bed Mobility: Supine to Sit     Supine to sit: Mod assist     General bed mobility comments: assist with L LE to EOB and to elevate trunk  Transfers Overall transfer level: Needs assistance Equipment used: Rolling walker (2 wheeled) Transfers: Sit to/from Stand Sit to Stand: Mod assist;Min assist              Balance Overall balance assessment: Needs assistance Sitting-balance support: Bilateral upper extremity supported;Feet supported Sitting balance-Leahy Scale: Fair     Standing balance support: Bilateral upper extremity supported;During functional activity Standing balance-Leahy Scale: Poor                             ADL either performed or assessed with clinical judgement   ADL Overall ADL's : Needs assistance/impaired     Grooming: Wash/dry hands;Wash/dry face;Sitting;Min guard;With caregiver independent assisting   Upper Body Bathing: Set  up;Supervision/ safety;Sitting;With caregiver independent assisting Upper Body Bathing Details (indicate cue type and reason): simulated Lower Body Bathing: With caregiver independent assisting;Moderate assistance;Sitting/lateral leans Lower Body Bathing Details (indicate cue type and reason): simulated Upper Body Dressing : Sitting;With caregiver independent assisting;Set up;Supervision/safety   Lower Body Dressing: Maximal assistance;With caregiver independent assisting   Toilet Transfer: Ambulation;RW;With caregiver independent assisting;Moderate assistance;Minimal assistance   Toileting- Clothing Manipulation and Hygiene: Moderate assistance;Sitting/lateral lean;Sit to/from stand;With caregiver independent assisting       Functional mobility during ADLs: Moderate assistance;Minimal assistance;Rolling walker;Caregiver able to provide necessary level of assistance       Vision Baseline Vision/History: No visual deficits Patient Visual Report: No change from baseline     Perception     Praxis      Cognition Arousal/Alertness: Awake/alert Behavior During Therapy: WFL for tasks assessed/performed Overall Cognitive Status: Within Functional Limits for tasks assessed                                          Exercises     Shoulder Instructions       General Comments      Pertinent Vitals/ Pain       Pain Assessment: Faces Faces Pain Scale: Hurts a little bit Pain Location: L foot with mobility Pain Descriptors / Indicators: Grimacing;Guarding;Sore Pain Intervention(s): Limited activity within patient's tolerance;Monitored during session;Repositioned  Home Living  Prior Functioning/Environment              Frequency  Min 2X/week        Progress Toward Goals  OT Goals(current goals can now be found in the care plan section)  Progress towards OT goals: Progressing toward goals      Plan Discharge plan remains appropriate    Co-evaluation                 AM-PAC OT "6 Clicks" Daily Activity     Outcome Measure   Help from another person eating meals?: None Help from another person taking care of personal grooming?: A Little Help from another person toileting, which includes using toliet, bedpan, or urinal?: A Lot Help from another person bathing (including washing, rinsing, drying)?: A Lot Help from another person to put on and taking off regular upper body clothing?: A Little Help from another person to put on and taking off regular lower body clothing?: A Lot 6 Click Score: 16    End of Session Equipment Utilized During Treatment: Gait belt;Rolling walker;Other (comment)(BSC)  OT Visit Diagnosis: Unsteadiness on feet (R26.81);Other abnormalities of gait and mobility (R26.89);Muscle weakness (generalized) (M62.81);History of falling (Z91.81) Pain - Right/Left: Left Pain - part of body: Leg;Ankle and joints of foot   Activity Tolerance Patient tolerated treatment well   Patient Left in chair;with call bell/phone within reach;with family/visitor present   Nurse Communication          Time: 7591-6384 OT Time Calculation (min): 25 min  Charges: OT General Charges $OT Visit: 1 Visit OT Treatments $Self Care/Home Management : 8-22 mins $Therapeutic Activity: 8-22 mins     Galen Manila 12/27/2018, 12:54 PM

## 2018-12-27 NOTE — Care Management Important Message (Signed)
Important Message  Patient Details  Name: Caroline Ramsey MRN: [id number] Date of Birth: 09/10/1927   Medicare Important Message Given:  Yes    Caroline Ramsey Caroline Ramsey 12/27/2018, 2:55 PM

## 2018-12-27 NOTE — Progress Notes (Signed)
    Subjective: Report via family-they assist with translation.  Pain managed throughout the day but more sore last night.  Good early mobilization.  Patient and family goal is to discharge to home with home therapy.  Objective:   VITALS:   Vitals:   12/26/18 0944 12/26/18 1428 12/26/18 2301 12/27/18 0504  BP: (!) 110/39 (!) 125/47 (!) 130/44 (!) 144/50  Pulse: 100 81 70 74  Resp: 16 16 18 16   Temp: 98.3 F (36.8 C) 98.3 F (36.8 C) 98.5 F (36.9 C) 99 F (37.2 C)  TempSrc: Oral Oral Oral Oral  SpO2: 93% (!) 89% 94% 93%  Weight:      Height:       CBC Latest Ref Rng & Units 12/27/2018 12/25/2018 12/24/2018  WBC 4.0 - 10.5 K/uL 12.5(H) 12.9(H) 14.6(H)  Hemoglobin 12.0 - 15.0 g/dL 8.6(L) 54.4 92.0  Hematocrit 36.0 - 46.0 % 28.8(L) 38.2 37.9  Platelets 150 - 400 K/uL 265 292 302   BMP Latest Ref Rng & Units 12/27/2018 12/25/2018 12/24/2018  Glucose 70 - 99 mg/dL 100(F) 121(F) 758(I)  BUN 8 - 23 mg/dL 32(P) 13 13  Creatinine 0.44 - 1.00 mg/dL 4.98 2.64 1.58  Sodium 135 - 145 mmol/L 136 137 137  Potassium 3.5 - 5.1 mmol/L 3.6 3.8 4.0  Chloride 98 - 111 mmol/L 103 105 104  CO2 22 - 32 mmol/L 23 24 23   Calcium 8.9 - 10.3 mg/dL 3.0(N) 9.4 40.7   Intake/Output      02/12 0701 - 02/13 0700   P.O. 240   Total Intake(mL/kg) 240 (4.8)   Net +240       Urine Occurrence 2 x      Physical Exam: General: NAD.  Supine in bed.  Answers questions and interacts appropriately with her family.  No increased work of breathing.  MSK LLE: Dorsiflexion, plantarflexion, EHL, FHL intact.  Sensation intact distally.  Feet warm.  Incision: dressing C/D/I   Assessment: 2 Days Post-Op  S/P Procedure(s) (LRB): LEFT HIP ARTHROPLASTY FOR FRACTURE (Left) by Dr. Jewel Baize. Ramsey on 12/25/2018  Active Problems:   Closed left hip fracture (HCC)   Cardiomegaly   Hypothyroidism   Essential hypertension   Closed left femoral neck fracture, status post hemiarthroplasty Pain controlled Good  early mobilization ABLA: 9.4<12.2.  Fracture/surgical blood loss.  Likely some dilutional component.  No sign of active bleeding.  Plan: Up with therapy Incentive Spirometry Apply ice as needed  Weightbearing: WBAT LLE Insicional and dressing care: Dressings left intact until follow-up Showering: Keep dressing dry VTE prophylaxis: Lovenox 40mg  qd, SCDs, ambulation Pain control: Minimize narcotics.  Norco for breakthrough pain. Follow - up plan: 2 weeks Contact information:  Caroline Rana MD, Caroline Hacker PA-C  Dispo: Plan currently is to discharge to home with HHPT.  Discharge when ready medically and mobilizing well enough for safe discharge to home.   Caroline Billet III, PA-C 12/27/2018, 6:38 AM

## 2018-12-27 NOTE — Progress Notes (Signed)
PROGRESS NOTE    Caroline AfricaKamlaben Jewell  ZOX:[id number] DOB: [birth year] DOA: 12/24/2018 [facility]   Brief Narrative:   Caroline Ramsey is a 85 y.o. female with medical history significant of htn, blind in left eye Presented with   Fall Jan 29th she missed the last step and fell down was caught by her Lucila MaineGrandson but still fell and landed on her left hip did not hit her head no LOC. Plain imaging neg initially but continued to have hip pain presented today.   Work-up significant for left femoral neck fracture, status post post surgical repair by Dr. Margarita RanaMurphy Timothy 12/25/2018.  Subjective:   Caroline AfricaKamlaben Hixon seen and examined today.  Daughter at bedside, help with translation, reports she had a poor night sleep, no bowel movement yet, does report some pain in her left hip, but overall is controlled   Assessment & Plan   Left femoral neck fracture  -Status post fall, on December 12, 2018 -CT scan showed left femoral neck fracture with varus angulation  -Orthopedics consulted and appreciated  -Echocardiogram as below  -Given patient's age, comorbidities, she is at least moderate risk for surgery  -Status post closed left hip fracture Dr. Margarita Ranaimothy Murphy 12/25/2018 -Tolerated surgery well, was seen by PT/OT, recommendation for home health, being arranged. -Minimize pain medication, encouraged to ambulate, -She does report some constipation, given some stool softener and laxatives -Continue with vitamin D supplement when low level  Cardiomegaly/heart murmur -Echocardiogram obtained: LV function 60-65%, LV diastolic dysfunction parameters consistent with impaired relaxation.  Elevated mean left atrial pressure.  No evidence of LV regional wall motion abnormalities.  Moderate aortic valve regurgitation. -Denies any dyspnea or chest pain today -We will likely need to follow-up with cardiology as an outpatient  Acute blood loss anemia -Postoperative, expected, will start on iron  supplement  Hypothyroidism -Continue Synthroid  Hyperlipidemia -Continue with home dose statin  Essential Hypertension -Continue metoprolol  Left eye blindness -Chronic  DVT Prophylaxis  SCDs, subcu Lovenox  Code Status: Full  Family Communication: Family at bedside  [facility]  Consultants Orthopedics  Procedures  Echocardiogram  Antibiotics   Anti-infectives (From admission, onward)   Start     Dose/Rate Route Frequency Ordered Stop   12/26/18 0230  ceFAZolin (ANCEF) IVPB 1 g/50 mL premix     1 g 100 mL/hr over 30 Minutes Intravenous  Once 12/25/18 1747 12/26/18 0213   12/25/18 0600  ceFAZolin (ANCEF) IVPB 2g/100 mL premix     2 g 200 mL/hr over 30 Minutes Intravenous On call to O.R. 12/25/18 0247 12/25/18 1441       Objective:   Vitals:   12/26/18 1428 12/26/18 2301 12/27/18 0504 12/27/18 0800  BP: (!) 125/47 (!) 130/44 (!) 144/50 (!) 137/51  Pulse: 81 70 74 75  Resp: 16 18 16 16   Temp: 98.3 F (36.8 C) 98.5 F (36.9 C) 99 F (37.2 C) 98.2 F (36.8 C)  TempSrc: Oral Oral Oral Oral  SpO2: (!) 89% 94% 93% 90%  Weight:      Height:        Intake/Output Summary (Last 24 hours) at 12/27/2018 1152 Last data filed at 12/27/2018 0900 Gross per 24 hour  Intake 240 ml  Output 300 ml  Net -60 ml   Filed Weights   12/24/18 1444  Weight: 49.9 kg    Exam   Awake Alert, frail, thin appearing, in no apparent distress  symmetrical Chest wall movement, Good air movement bilaterally,  CTAB RRR,No Gallops,Rubs or new Murmurs, No Parasternal Heave +ve B.Sounds, Abd Soft, No tenderness, No rebound - guarding or rigidity. No Cyanosis, Clubbing or edema, left lower extremity with knee immobilizer, site of surgery in left upper hip with no blood or discharge can be seen through her gauze    Data Reviewed: I have personally reviewed following labs and imaging studies  CBC: Recent Labs  Lab 12/24/18 2001 12/25/18 0312 12/27/18 0256   WBC 14.6* 12.9* 12.5*  NEUTROABS 12.3*  --   --   HGB 12.2 12.7 9.4*  HCT 37.9 38.2 28.8*  MCV 90.7 91.4 91.7  PLT 302 292 265   Basic Metabolic Panel: Recent Labs  Lab 12/24/18 2001 12/25/18 0312 12/27/18 0256  NA 137 137 136  K 4.0 3.8 3.6  CL 104 105 103  CO2 23 24 23   GLUCOSE 138* 127* 112*  BUN 13 13 26*  CREATININE 0.87 0.90 0.95  CALCIUM 10.0 9.4 8.5*   GFR: Estimated Creatinine Clearance: 27.3 mL/min (by C-G formula based on SCr of 0.95 mg/dL). Liver Function Tests: Recent Labs  Lab 12/25/18 0312  AST 21  ALT 13  ALKPHOS 78  BILITOT 0.8  PROT 7.1  ALBUMIN 2.9*   No results for input(s): LIPASE, AMYLASE in the last 168 hours. No results for input(s): AMMONIA in the last 168 hours. Coagulation Profile: Recent Labs  Lab 12/24/18 2001  INR 1.02   Cardiac Enzymes: No results for input(s): CKTOTAL, CKMB, CKMBINDEX, TROPONINI in the last 168 hours. BNP (last 3 results) No results for input(s): PROBNP in the last 8760 hours. HbA1C: No results for input(s): HGBA1C in the last 72 hours. CBG: No results for input(s): GLUCAP in the last 168 hours. Lipid Profile: No results for input(s): CHOL, HDL, LDLCALC, TRIG, CHOLHDL, LDLDIRECT in the last 72 hours. Thyroid Function Tests: No results for input(s): TSH, T4TOTAL, FREET4, T3FREE, THYROIDAB in the last 72 hours. Anemia Panel: No results for input(s): VITAMINB12, FOLATE, FERRITIN, TIBC, IRON, RETICCTPCT in the last 72 hours. Urine analysis:    Component Value Date/Time   COLORURINE YELLOW 12/25/2018 0152   APPEARANCEUR CLEAR 12/25/2018 0152   LABSPEC 1.023 12/25/2018 0152   PHURINE 6.0 12/25/2018 0152   GLUCOSEU NEGATIVE 12/25/2018 0152   HGBUR SMALL (A) 12/25/2018 0152   BILIRUBINUR NEGATIVE 12/25/2018 0152   KETONESUR 5 (A) 12/25/2018 0152   PROTEINUR NEGATIVE 12/25/2018 0152   UROBILINOGEN 0.2 01/22/2015 1130   NITRITE NEGATIVE 12/25/2018 0152   LEUKOCYTESUR NEGATIVE 12/25/2018 0152   Sepsis  Labs: @LABRCNTIP (procalcitonin:4,lacticidven:4)  )No results found for this or any previous visit (from the past 240 hour(s)).    Radiology Studies: Dg Femur Port Min 2 Views Left  Result Date: 12/25/2018 CLINICAL DATA:  Hip replacement EXAM: LEFT FEMUR PORTABLE 2 VIEWS COMPARISON:  None. FINDINGS: Changes of left hip replacement. No hardware complicating feature. No acute bony abnormality. IMPRESSION: Left hip replacement.  No visible complicating feature. Electronically Signed   By: Charlett Nose M.D.   On: 12/25/2018 19:44     Scheduled Meds: . cholecalciferol  1,000 Units Oral Daily  . enoxaparin (LOVENOX) injection  30 mg Subcutaneous Q24H  . levothyroxine  75 mcg Oral QAC breakfast  . metoprolol succinate  25 mg Oral Daily  . senna-docusate  2 tablet Oral BID  . simvastatin  10 mg Oral Daily   Continuous Infusions: . methocarbamol (ROBAXIN) IV       LOS: 3 days    Huey Bienenstock MD. on 12/27/2018  at 11:52 AM  Between 7am to 7pm - Please see pager noted on [email or web address]  After 7pm go to [email or web address]  And look for the night coverage person covering for me after hours  [facility] Office  [phone]

## 2018-12-27 NOTE — Progress Notes (Signed)
RN taught patients daughter how to administer Lovenox injection which she will be receiving at home. She stated understanding. Will notify RN to let daughter try next injection.

## 2018-12-28 DIAGNOSIS — S72002S Fracture of unspecified part of neck of left femur, sequela: Secondary | ICD-10-CM

## 2018-12-28 LAB — CBC
HCT: 28.6 % — ABNORMAL LOW (ref 36.0–46.0)
Hemoglobin: 9.4 g/dL — ABNORMAL LOW (ref 12.0–15.0)
MCH: 29.9 pg (ref 26.0–34.0)
MCHC: 32.9 g/dL (ref 30.0–36.0)
MCV: 91.1 fL (ref 80.0–100.0)
Platelets: 293 10*3/uL (ref 150–400)
RBC: 3.14 MIL/uL — ABNORMAL LOW (ref 3.87–5.11)
RDW: 13.3 % (ref 11.5–15.5)
WBC: 13.4 10*3/uL — ABNORMAL HIGH (ref 4.0–10.5)
nRBC: 0 % (ref 0.0–0.2)

## 2018-12-28 MED ORDER — VITAMIN D 25 MCG (1000 UNIT) PO TABS: 1000.0000 [IU] | ORAL_TABLET | Freq: Every day | ORAL | 0 refills | Status: AC

## 2018-12-28 MED ORDER — FERROUS SULFATE 325 (65 FE) MG PO TABS: 325.0000 mg | ORAL_TABLET | Freq: Two times a day (BID) | ORAL | 3 refills | Status: AC

## 2018-12-28 MED ORDER — ENOXAPARIN SODIUM 30 MG/0.3ML ~~LOC~~ SOLN: 30.0000 mg | SUBCUTANEOUS | 0 refills | Status: AC

## 2018-12-28 NOTE — Progress Notes (Signed)
Physical Therapy Treatment Patient Details Name: Caroline Ramsey MRN: [id number] DOB: [birth year] Today's Date: 12/28/2018    History of Present Illness Caroline Ramsey is a 85 y.o. female with medical history significant of HTN, blind in left eye; Larey Seat 1/29, plain imaging neg at that time, but pain persisted and she came to ED 2/11; imaging showed L fem neck fracture; Now s/p L hip arthroplasty, WBAT    PT Comments    Patient tolerated increased gait training distance this session. Pt does continue to need assistance for balance and guiding RW. Pt's daughter present, given gait belt, and educated on use of gait belt. Pt's daughter reports pt will have 24 hour assistance at d/c. Current plan remains appropriate.    Follow Up Recommendations  Home health PT;Supervision/Assistance - 24 hour;Other (comment)(as well as HHOT and Aide)     Equipment Recommendations  Rolling walker with 5" wheels;Other (comment)(Youth-sized)    Recommendations for Other Services       Precautions / Restrictions Precautions Precautions: Posterior Hip Precaution Comments: precautions reviewed with pt and pt's family  Restrictions Weight Bearing Restrictions: Yes LLE Weight Bearing: Weight bearing as tolerated    Mobility  Bed Mobility Overal bed mobility: Needs Assistance Bed Mobility: Supine to Sit     Supine to sit: Mod assist     General bed mobility comments: assist to bring bilat LE and hips to EOB   Transfers Overall transfer level: Needs assistance Equipment used: Rolling walker (2 wheeled) Transfers: Sit to/from Stand Sit to Stand: Min assist         General transfer comment: assist to power up and to gain balance upon standing; cues for safe hand placement  Ambulation/Gait Ambulation/Gait assistance: Mod assist;+2 safety/equipment Gait Distance (Feet): 100 Feet Assistive device: Rolling walker (2 wheeled)(youth-sized ) Gait Pattern/deviations: Step-to pattern;Decreased step length  - right;Decreased step length - left;Trunk flexed Gait velocity: decreased   General Gait Details: assist for balance and to guide RW   Stairs             Wheelchair Mobility    Modified Rankin (Stroke Patients Only)       Balance Overall balance assessment: Needs assistance Sitting-balance support: Bilateral upper extremity supported;Feet supported Sitting balance-Leahy Scale: Fair     Standing balance support: Bilateral upper extremity supported;During functional activity Standing balance-Leahy Scale: Poor                              Cognition Arousal/Alertness: Awake/alert Behavior During Therapy: WFL for tasks assessed/performed Overall Cognitive Status: Difficult to assess                                        Exercises      General Comments General comments (skin integrity, edema, etc.): daughter present and interpreted; pt's daughter given gait belt and educated on use       Pertinent Vitals/Pain Pain Assessment: Faces Faces Pain Scale: Hurts a little bit Pain Location: L hip Pain Descriptors / Indicators: Discomfort Pain Intervention(s): Limited activity within patient's tolerance;Monitored during session;Repositioned    Home Living                      Prior Function            PT Goals (current goals can now be found in the care plan  section) Progress towards PT goals: Progressing toward goals    Frequency    Min 6X/week      PT Plan Current plan remains appropriate    Co-evaluation              AM-PAC PT "6 Clicks" Mobility   Outcome Measure  Help needed turning from your back to your side while in a flat bed without using bedrails?: A Lot Help needed moving from lying on your back to sitting on the side of a flat bed without using bedrails?: A Lot Help needed moving to and from a bed to a chair (including a wheelchair)?: A Lot Help needed standing up from a chair using your arms  (e.g., wheelchair or bedside chair)?: A Little Help needed to walk in hospital room?: A Little Help needed climbing 3-5 steps with a railing? : A Little 6 Click Score: 15    End of Session Equipment Utilized During Treatment: Gait belt Activity Tolerance: Patient tolerated treatment well Patient left: in chair;with call bell/phone within reach;with family/visitor present Nurse Communication: Mobility status PT Visit Diagnosis: Other abnormalities of gait and mobility (R26.89);Difficulty in walking, not elsewhere classified (R26.2)     Time: 1040-1109 PT Time Calculation (min) (ACUTE ONLY): 29 min  Charges:  $Gait Training: 23-37 mins                     Erline Levine, PTA Acute Rehabilitation Services Pager: [phone] Office: [phone]     [name] 12/28/2018, 4:51 PM

## 2018-12-28 NOTE — Plan of Care (Signed)
  Problem: Activity: Goal: Risk for activity intolerance will decrease Outcome: Progressing   Problem: Nutrition: Goal: Adequate nutrition will be maintained Outcome: Progressing   Problem: Elimination: Goal: Will not experience complications related to bowel motility Outcome: Progressing   

## 2018-12-28 NOTE — Progress Notes (Signed)
    Subjective: Report via family-they assist with translation.  Pain improving.  Good early mobilization.  Had diarrhea yesterday after MagCitrate and Miralax.  Objective:   VITALS:   Vitals:   12/27/18 0800 12/27/18 1345 12/27/18 2206 12/28/18 0500  BP: (!) 137/51 (!) 177/64 (!) 107/44 (!) 112/48  Pulse: 75 85 80 68  Resp: 16 16 (!) 22 18  Temp: 98.2 F (36.8 C) 98.7 F (37.1 C) 99 F (37.2 C) 98.4 F (36.9 C)  TempSrc: Oral Oral Oral Oral  SpO2: 90% 90% 93% 94%  Weight:      Height:       CBC Latest Ref Rng & Units 12/28/2018 12/27/2018 12/25/2018  WBC 4.0 - 10.5 K/uL 13.4(H) 12.5(H) 12.9(H)  Hemoglobin 12.0 - 15.0 g/dL 9.8(M) 2.1(I) 31.2  Hematocrit 36.0 - 46.0 % 28.6(L) 28.8(L) 38.2  Platelets 150 - 400 K/uL 293 265 292   BMP Latest Ref Rng & Units 12/27/2018 12/25/2018 12/24/2018  Glucose 70 - 99 mg/dL 811(W) 867(R) 373(G)  BUN 8 - 23 mg/dL 68(D) 13 13  Creatinine 0.44 - 1.00 mg/dL 5.94 7.07 6.15  Sodium 135 - 145 mmol/L 136 137 137  Potassium 3.5 - 5.1 mmol/L 3.6 3.8 4.0  Chloride 98 - 111 mmol/L 103 105 104  CO2 22 - 32 mmol/L 23 24 23   Calcium 8.9 - 10.3 mg/dL 1.8(D) 9.4 43.7   Intake/Output      02/13 0701 - 02/14 0700 02/14 0701 - 02/15 0700   P.O. 360    Total Intake(mL/kg) 360 (7.2)    Urine (mL/kg/hr) 300 (0.3)    Stool 0    Total Output 300    Net +60         Urine Occurrence 2 x    Stool Occurrence 6 x       Physical Exam: General: NAD.  Supine in bed.  Answers questions and interacts appropriately with her family.  No increased work of breathing.  MSK LLE: Dorsiflexion, plantarflexion, EHL, FHL intact.  Sensation intact distally.  Feet warm.  Incision: dressing C/D/I   Assessment: 3 Days Post-Op  S/P Procedure(s) (LRB): LEFT HIP ARTHROPLASTY FOR FRACTURE (Left) by Dr. Jewel Baize. Eulah Pont on 12/25/2018  Active Problems:   Closed left hip fracture (HCC)   Cardiomegaly   Hypothyroidism   Essential hypertension   Closed left femoral neck  fracture, status post hemiarthroplasty Pain controlled Good early mobilization ABLA: Stable 9.4<9.4<12.2.  Fracture/surgical blood loss/hemodilution.  Plan: Up with therapy Incentive Spirometry Apply ice as needed  Weightbearing: WBAT LLE Insicional and dressing care: Dressings left intact until follow-up Showering: Keep dressing dry VTE prophylaxis: Lovenox 40mg  qd, SCDs, ambulation Pain control: Minimize narcotics.  Norco for breakthrough pain. Follow - up plan: 2 weeks Contact information:  [name] MD, [name] PA-C  Dispo:  Discharge when ready medically.  Follow up in the office with Dr. Wandra Feinstein in 2 weeks.  Please call with questions.   Lucretia Kern Martensen III, PA-C 12/28/2018, 7:28 AM

## 2018-12-28 NOTE — Discharge Instructions (Signed)
Follow with Primary MD [facility] in 7 days   Get CBC, CMP, 2 view Chest X ray checked  by Primary MD next visit.    Activity: with PT   Disposition Home    Diet: regular diet   On your next visit with your primary care physician please Get Medicines reviewed and adjusted.   Please request your Prim.MD to go over all Hospital Tests and Procedure/Radiological results at the follow up, please get all Hospital records sent to your Prim MD by signing hospital release before you go home.   If you experience worsening of your admission symptoms, develop shortness of breath, life threatening emergency, suicidal or homicidal thoughts you must seek medical attention immediately by calling 911 or calling your MD immediately  if symptoms less severe.  You Must read complete instructions/literature along with all the possible adverse reactions/side effects for all the Medicines you take and that have been prescribed to you. Take any new Medicines after you have completely understood and accpet all the possible adverse reactions/side effects.   Do not drive, operating heavy machinery, perform activities at heights, swimming or participation in water activities or provide baby sitting services if your were admitted for syncope or siezures until you have seen by Primary MD or a Neurologist and advised to do so again.  Do not drive when taking Pain medications.    Do not take more than prescribed Pain, Sleep and Anxiety Medications  Special Instructions: If you have smoked or chewed Tobacco  in the last 2 yrs please stop smoking, stop any regular Alcohol  and or any Recreational drug use.  Wear Seat belts while driving.   Please note  You were cared for by a hospitalist during your hospital stay. If you have any questions about your discharge medications or the care you received while you were in the hospital after you are discharged, you can call the unit and asked to speak with  the hospitalist on call if the hospitalist that took care of you is not available. Once you are discharged, your primary care physician will handle any further medical issues. Please note that NO REFILLS for any discharge medications will be authorized once you are discharged, as it is imperative that you return to your primary care physician (or establish a relationship with a primary care physician if you do not have one) for your aftercare needs so that they can reassess your need for medications and monitor your lab values.

## 2018-12-28 NOTE — Discharge Summary (Signed)
Caroline Ramsey, is a 85 y.o. female  DOB 1927-05-15  MRN [id number].  Admission date:  12/24/2018  Admitting Physician  Therisa Doyne, MD  Discharge Date:  12/28/2018   Primary MD  Center, [facility]  Recommendations for primary care physician for things to follow:  -Check CBC, BMP during next visit -To follow with orthopedic as an outpatient within 10 days   Admission Diagnosis  Closed fracture of left hip, initial encounter [facility]) [S72.002A]   Discharge Diagnosis  Closed fracture of left hip, initial encounter (HCC) [S72.002A]    Active Problems:   Closed left hip fracture (HCC)   Cardiomegaly   Hypothyroidism   Essential hypertension      Past Medical History:  Diagnosis Date  . Blind left eye   . High cholesterol   . Hypothyroidism   . Pneumonia 02/20/2013   LLL    Past Surgical History:  Procedure Laterality Date  . CATARACT EXTRACTION, BILATERAL  2006  . HIP ARTHROPLASTY Left 12/25/2018   Procedure: LEFT HIP ARTHROPLASTY FOR FRACTURE;  Surgeon: Sheral Apley, MD;  Location: [facility] OR;  Service: Orthopedics;  Laterality: Left;  . LARYNX SURGERY  ~ 2000   "couldn't speak; had to have surgery" (02/20/2013)       History of present illness and  Hospital Course:     Kindly see H&P for history of present illness and admission details, please review complete Labs, Consult reports and Test reports for all details in brief  HPI  from the history and physical done on the day of admission 12/24/2018 Caroline Ramsey is a 85 y.o. female with medical history significant of htn, blind in left eye   Presented with   Fall Jan 29th she missed the last step and fell down was caught by her Lucila Maine but still fell and landed on her left hip did not hit her head no LOC. Plain imaging neg initially but continued to have hip pain presented today.  Left fem neck fracture.  she never reports any  chest pain walks at baseline without assist No dyspnea, no known hx of CAD  Regarding pertinent Chronic problems: hypertension on metoprolol no longer on Norvasc, hx of hypothyroidism on synthroid   While in ER: Plain imaging showing Significantly displaced fracture of the LEFT femoral neck, The following Work up has been ordered so far:    Hospital Course   Caroline Ramsey a 85 y.o.femalewith medical history significant of htn, blind in left eye Presented withFall Jan 29thshe missed the last step and fell down was caught by her Lucila Maine but still fell and landed on her left hip did not hit her head no LOC. Plain imaging neginitially but continued to have hip pain presented today.  Work-up significant for left femoral neck fracture, status post post surgical repair by Dr. [name] 12/25/2018.  Left femoral neck fracture  -Status post fall, on December 12, 2018 -CT scan showed left femoral neck fracture with varus angulation  -Orthopedics consulted and appreciated  -Given patient's age, comorbidities,  she is at least moderate risk for surgery  -Status post closed left hip fracture Dr. Margarita Ranaimothy Murphy 12/25/2018 -Tolerated surgery well, was seen by PT/OT, recommendation for home health, being arranged. -Minimize pain medication, encouraged to ambulate -Continue with vitamin D supplement given low level of vitamin D  Cardiomegaly/heart murmur -Echocardiogram obtained: LV function 60-65%, LV diastolic dysfunction parameters consistent with impaired relaxation.  Elevated mean left atrial pressure.  No evidence of LV regional wall motion abnormalities.  Moderate aortic valve regurgitation. -Denies any dyspnea or chest pain today - will likely need to follow-up with cardiology as an outpatient  Acute blood loss anemia -Postoperative, expected,  hemoglobin stable at 9.4 on discharge, has been started on iron supplements  Hypothyroidism -Continue  Synthroid  Hyperlipidemia -Continue with home dose statin  Essential Hypertension -Continue metoprolol  Left eye blindness -Chronic Discharge Condition:  stable   Follow UP  Follow-up Information    Sheral ApleyMurphy, Timothy D, MD.   Specialty:  Orthopedic Surgery Contact information: [address] [place] [id number] [phone]        [facility] Follow up.   Why:  A represntative from [facility] will contact you to arrange start date and time for therapy.  Contact information: [phone]            Discharge Instructions  and  Discharge Medications     Discharge Instructions    Discharge instructions   Complete by:  As directed    LEFT HIP FRACTURE     Allergies as of 12/28/2018   No Known Allergies     Medication List    TAKE these medications   cholecalciferol 25 MCG (1000 UT) tablet Commonly known as:  VITAMIN D3 Take 1 tablet (1,000 Units total) by mouth daily. Start taking on:  December 29, 2018   enoxaparin 30 MG/0.3ML injection Commonly known as:  LOVENOX Inject 0.3 mLs (30 mg total) into the skin daily for 28 days. Start taking on:  December 29, 2018   ferrous sulfate 325 (65 FE) MG tablet Take 1 tablet (325 mg total) by mouth 2 (two) times daily with a meal.   HYDROcodone-acetaminophen 5-325 MG tablet Commonly known as:  NORCO Take 1 tablet by mouth every 8 (eight) hours as needed for severe pain (Use Tylenol for mild / moderate pain.).   levothyroxine 75 MCG tablet Commonly known as:  SYNTHROID, LEVOTHROID Take 75 mcg by mouth daily before breakfast.   metoprolol succinate 25 MG 24 hr tablet Commonly known as:  TOPROL-XL Take 25 mg by mouth daily.   omeprazole 20 MG capsule Commonly known as:  PRILOSEC Take 20 mg by mouth daily.   simvastatin 10 MG tablet Commonly known as:  ZOCOR Take 10 mg by mouth daily.            Durable Medical Equipment  (From admission, onward)          Start     Ordered   12/28/18 1050  For home use only DME Walker rolling  [facility](Walkers)  Once    Comments:  YOUTH SIZE  Question:  Patient needs a walker to treat with the following condition  Answer:  Hip fracture (HCC)   12/28/18 1049   12/27/18 1042  For home use only DME Walker rolling  Once    Comments:  Youth walker  Question:  Patient needs a walker to treat with the following condition  Answer:  S/P total hip arthroplasty   12/27/18 1043  Diet and Activity recommendation: See Discharge Instructions above   Consults obtained -  orthopedic   Major procedures and Radiology Reports - PLEASE review detailed and final reports for all details, in brief -     Dg Chest 1 View  Result Date: 12/24/2018 CLINICAL DATA:  Fall, preop EXAM: CHEST  1 VIEW COMPARISON:  01/22/2015 FINDINGS: There is hyperinflation of the lungs compatible with COPD. Cardiomegaly with vascular congestion. No overt edema. Suspect small effusions. No confluent opacities or acute bony abnormality. IMPRESSION: COPD. Cardiomegaly, vascular congestion.  Suspect small effusions. Electronically Signed   By: Charlett Nose M.D.   On: 12/24/2018 19:59   Ct Hip Left Wo Contrast  Result Date: 12/24/2018 CLINICAL DATA:  Hip trauma.  Fracture. EXAM: CT OF THE LEFT HIP WITHOUT CONTRAST TECHNIQUE: Multidetector CT imaging of the left hip was performed according to the standard protocol. Multiplanar CT image reconstructions were also generated. COMPARISON:  Plain films 12/24/2018 FINDINGS: There is a left femoral neck fracture, basicervical with varus angulation. No subluxation or dislocation within the left hip. Mild degenerative changes within the left hip. No soft tissue abnormality within the visualized intra-abdominal space. IMPRESSION: Left femoral neck fracture with varus angulation as seen on prior plain films. Electronically Signed   By: Charlett Nose M.D.   On: 12/24/2018 22:59   Dg Hip Unilat With Pelvis 2-3  Views Left  Result Date: 12/24/2018 CLINICAL DATA:  Fall with persistent pain since fall. EXAM: DG HIP (WITH OR WITHOUT PELVIS) 2-3V LEFT COMPARISON:  None. FINDINGS: Significantly displaced fracture of the LEFT femoral neck, intertrochanteric, with nearly 90 degree angulation deformity at the fracture site. Femoral head remains appropriately positioned relative to the acetabulum. No additional fracture seen within the osseous pelvis or about the RIGHT hip. Soft tissues about the pelvis and LEFT hip are unremarkable. IMPRESSION: Significantly displaced fracture of the LEFT femoral neck, intertrochanteric, with nearly 90 degree angulation deformity at the fracture site. Electronically Signed   By: Bary Richard M.D.   On: 12/24/2018 18:40   Dg Femur Port Min 2 Views Left  Result Date: 12/25/2018 CLINICAL DATA:  Hip replacement EXAM: LEFT FEMUR PORTABLE 2 VIEWS COMPARISON:  None. FINDINGS: Changes of left hip replacement. No hardware complicating feature. No acute bony abnormality. IMPRESSION: Left hip replacement.  No visible complicating feature. Electronically Signed   By: Charlett Nose M.D.   On: 12/25/2018 19:44    Micro Results    No results found for this or any previous visit (from the past 240 hour(s)).     Today   Subjective:   Caroline Ramsey today had a good night sleep with daughter-in-law at bedside, she did have multiple bowel movements after she received laxatives yesterday, brief episode of confusion per daughter-in-law, otherwise no significant events .  Objective:   Blood pressure (!) 132/52, pulse 79, temperature 98.8 F (37.1 C), temperature source Oral, resp. rate 16, height 4\' 9"  (1.448 m), weight 49.9 kg, SpO2 95 %.   Intake/Output Summary (Last 24 hours) at 12/28/2018 1057 Last data filed at 12/27/2018 1700 Gross per 24 hour  Intake 240 ml  Output -  Net 240 ml    Exam Awake Alert, pleasent, laying in bed in no apparent distress [place].AT,PERRAL Supple Neck,No  JVD, No cervical lymphadenopathy appriciated.  Symmetrical Chest wall movement, Good air movement bilaterally, CTAB RRR,No Gallops,Rubs or new Murmurs, No Parasternal Heave +ve B.Sounds, Abd Soft, Non tender,  No rebound -guarding or rigidity. No Cyanosis, lateral left hip  surgical bandage with no bleeding or ecchymosis noted, has left knee immobilizer.  Data Review   CBC w Diff:  Lab Results  Component Value Date   WBC 13.4 (H) 12/28/2018   HGB 9.4 (L) 12/28/2018   HCT 28.6 (L) 12/28/2018   PLT 293 12/28/2018   LYMPHOPCT 9 12/24/2018   MONOPCT 6 12/24/2018   EOSPCT 0 12/24/2018   BASOPCT 0 12/24/2018    CMP:  Lab Results  Component Value Date   NA 136 12/27/2018   K 3.6 12/27/2018   CL 103 12/27/2018   CO2 23 12/27/2018   BUN 26 (H) 12/27/2018   CREATININE 0.95 12/27/2018   PROT 7.1 12/25/2018   ALBUMIN 2.9 (L) 12/25/2018   BILITOT 0.8 12/25/2018   ALKPHOS 78 12/25/2018   AST 21 12/25/2018   ALT 13 12/25/2018  .   Total Time in preparing paper work, data evaluation and todays exam - 35 minutes  Huey Bienenstock M.D on 12/28/2018 at 10:57 AM  [facility]   Office  [phone]

## 2018-12-28 NOTE — Plan of Care (Signed)

## 2019-11-26 IMAGING — DX DG FEMUR 2+V PORT*L*
4 series · 4 of 4 positions shown · non-contrast
Comparison: None.

CLINICAL DATA: Hip replacement

EXAM:
LEFT FEMUR PORTABLE 2 VIEWS

[femur ap (1 of 2)]
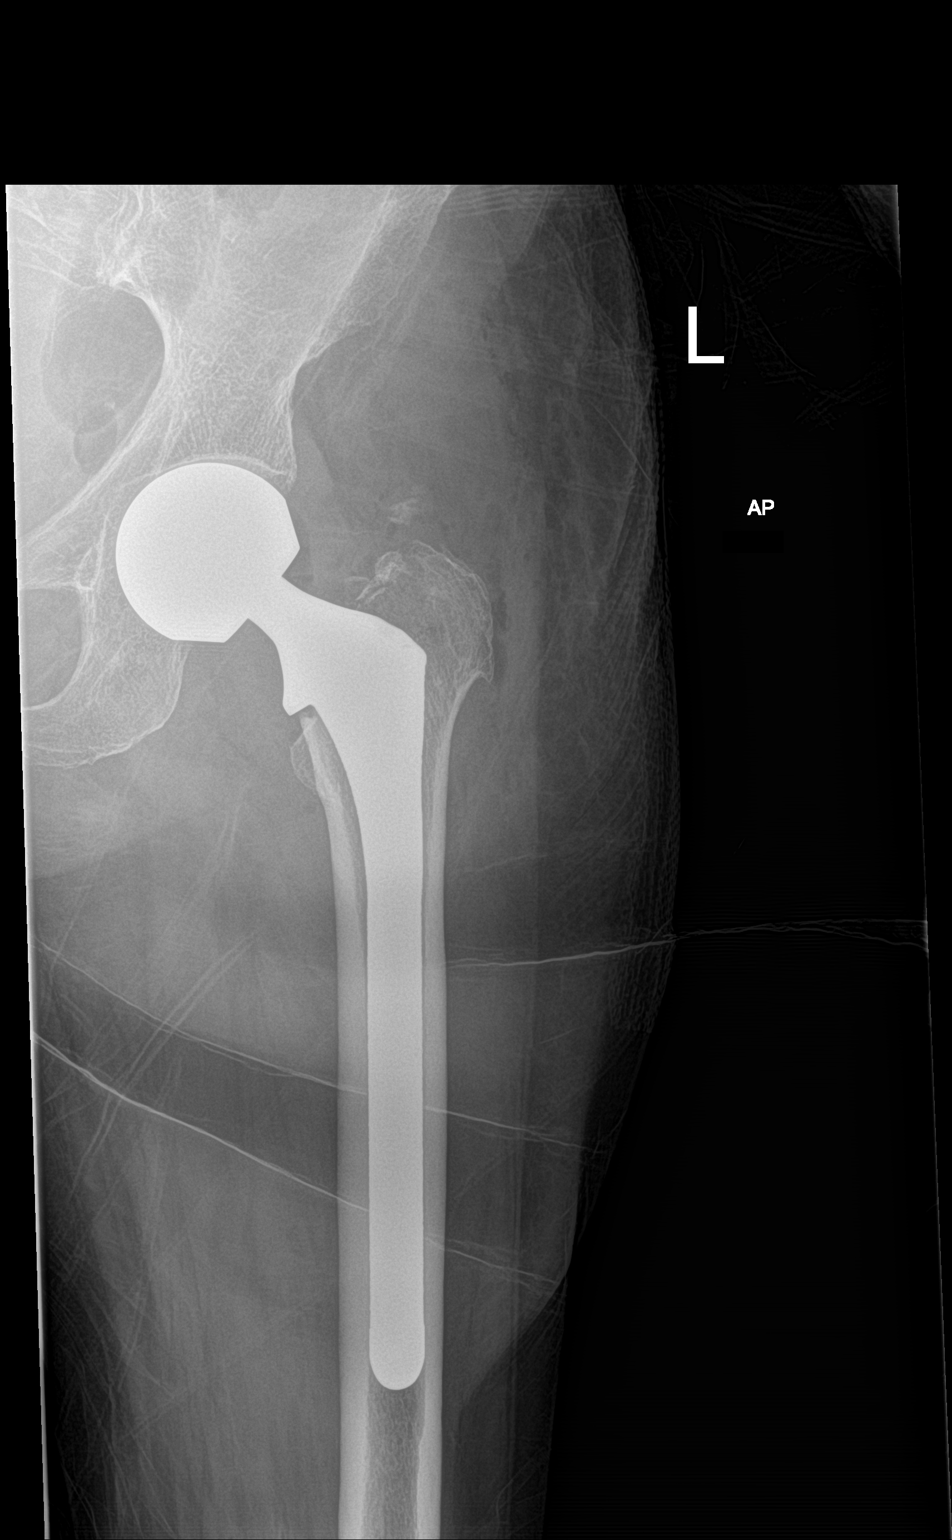

[femur ap (2 of 2)]
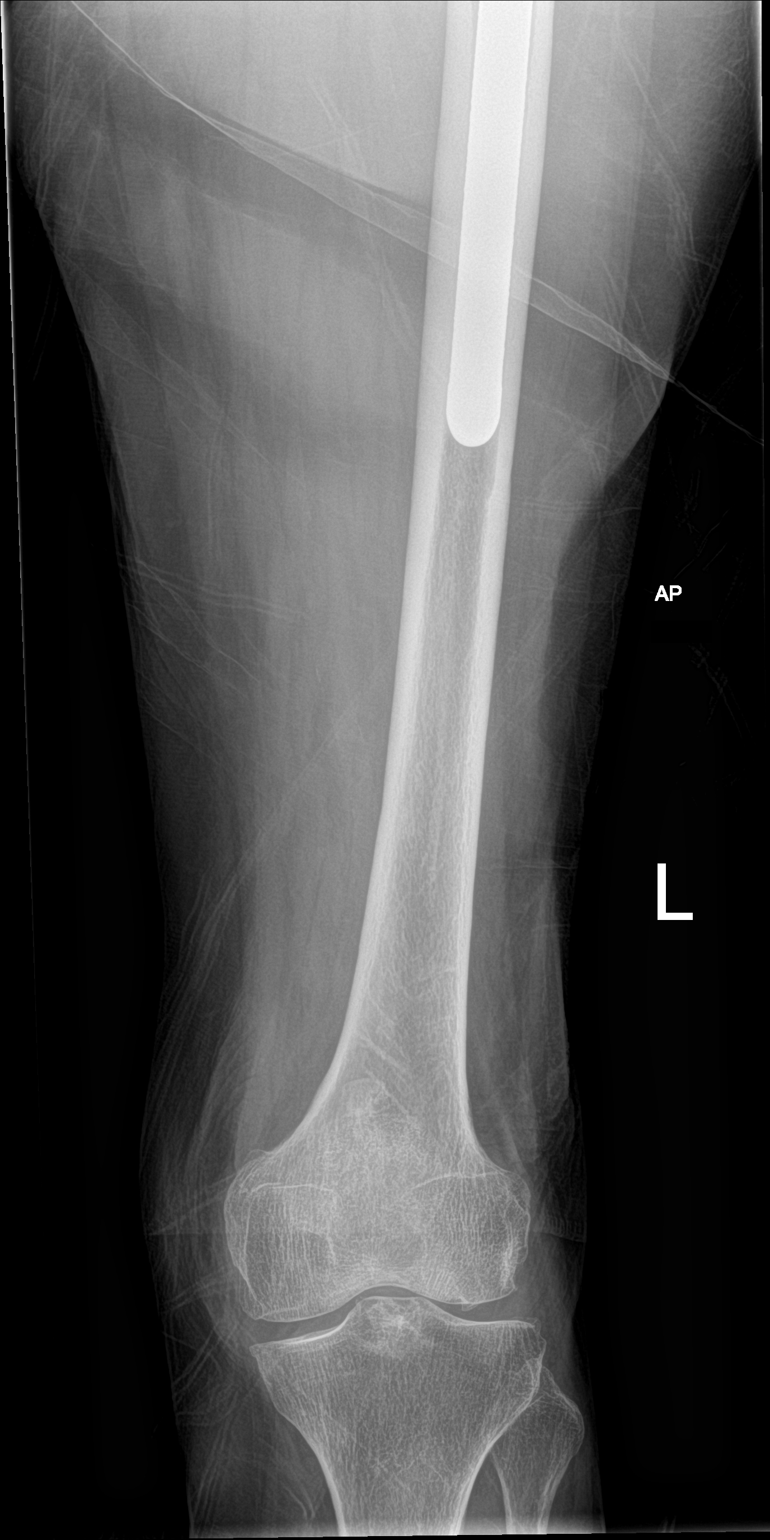

[femur lat (1 of 2)]
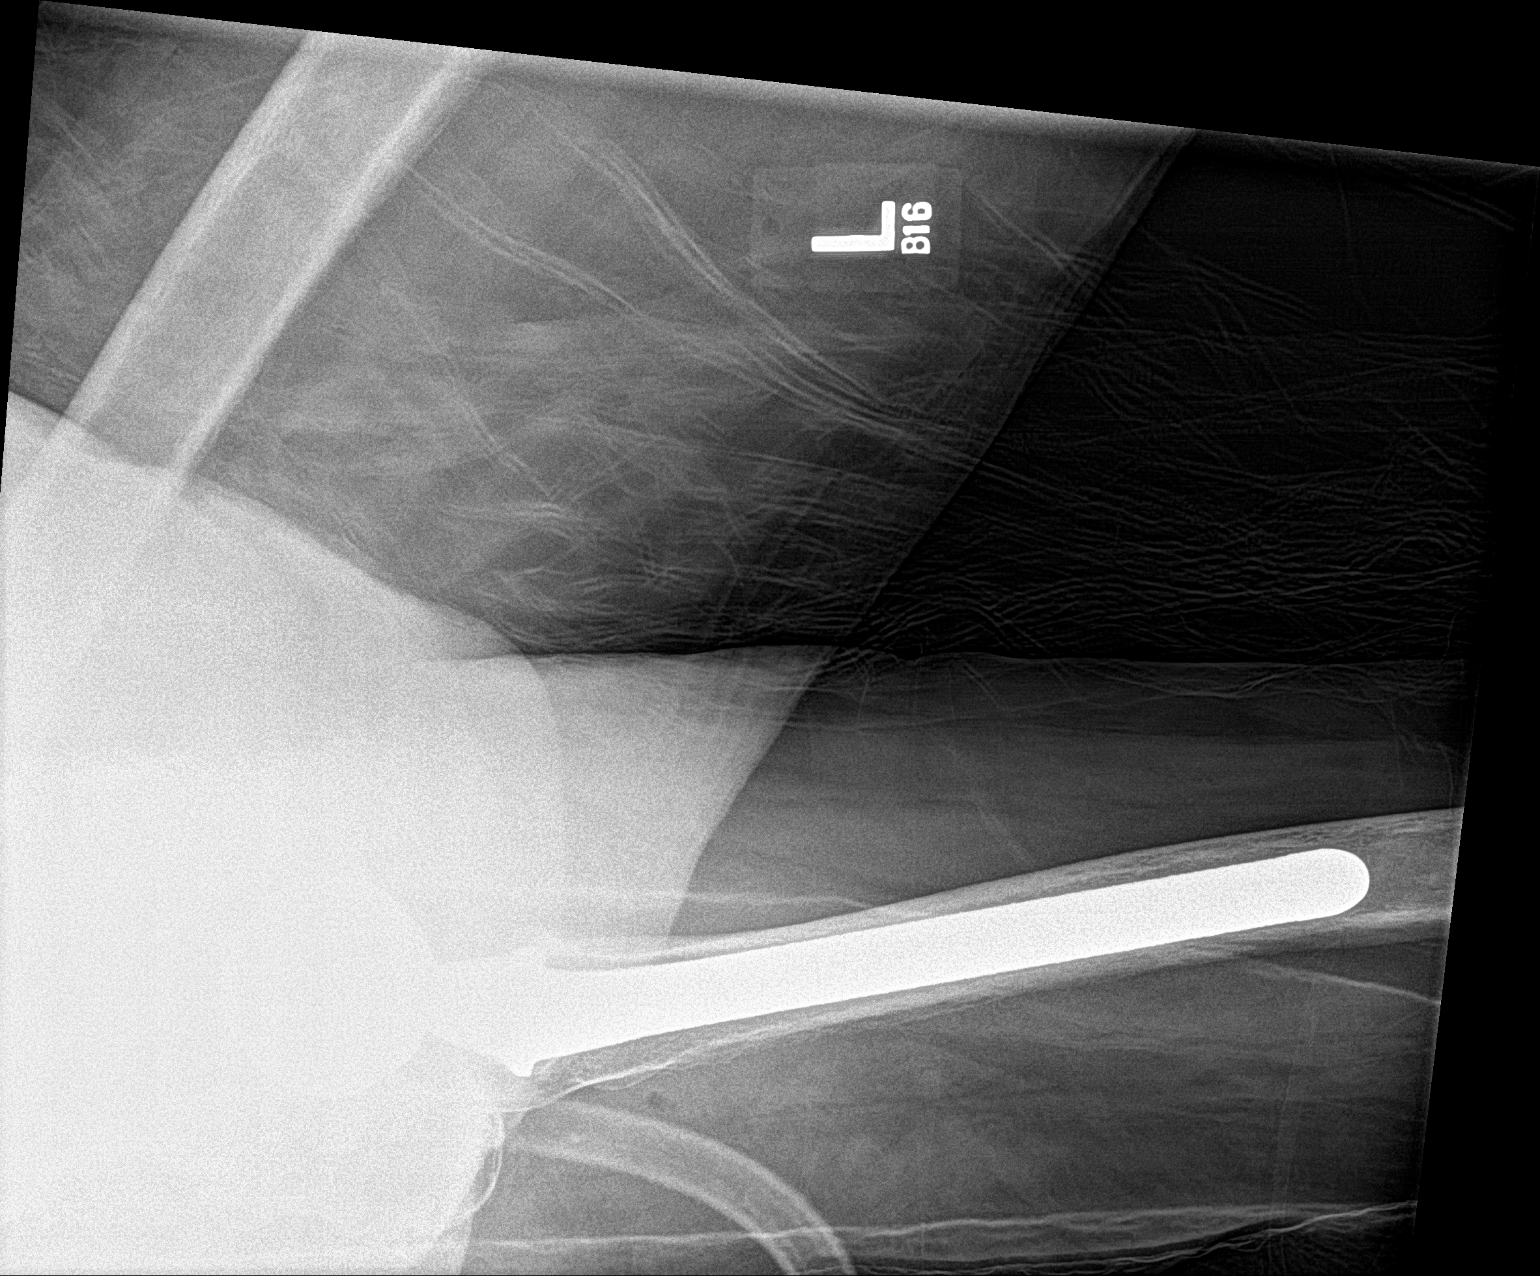

[femur lat (2 of 2)]
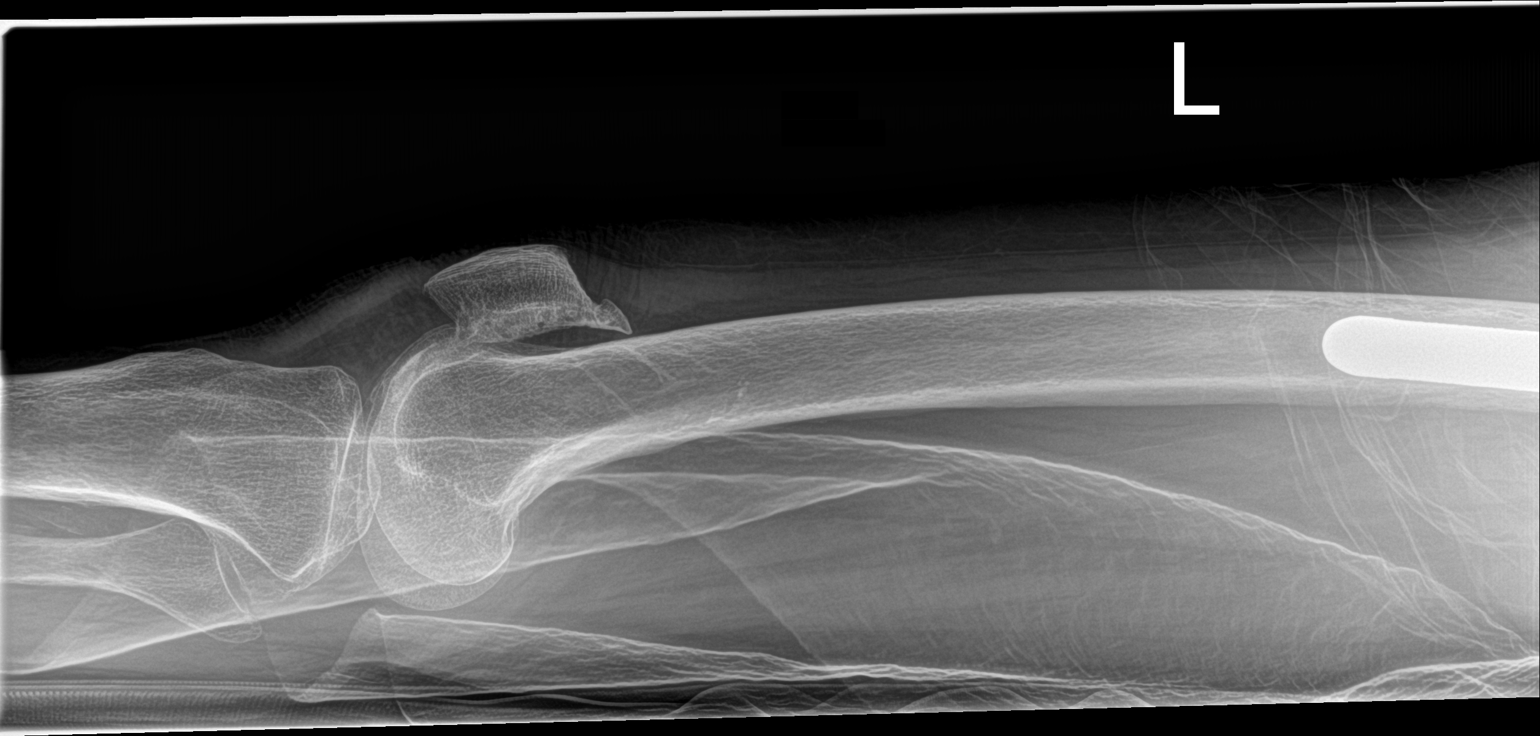

[4 of 4 positions shown; findings below may reference images not displayed]

FINDINGS: Changes of left hip replacement. No hardware complicating feature.
No acute bony abnormality.
IMPRESSION: Left hip replacement.  No visible complicating feature.

## 2022-09-14 DEATH — deceased
